# Patient Record
Sex: Female | Born: 1940 | Race: White | Hispanic: No | Marital: Married | Smoking: Never smoker
Health system: Southern US, Community
[De-identification: ages and names within clinical notes are randomized; demographics above are authoritative.]

## PROBLEM LIST (undated history)

## (undated) DIAGNOSIS — I519 Heart disease, unspecified: Secondary | ICD-10-CM

## (undated) DIAGNOSIS — I2510 Atherosclerotic heart disease of native coronary artery without angina pectoris: Secondary | ICD-10-CM

## (undated) DIAGNOSIS — C801 Malignant (primary) neoplasm, unspecified: Secondary | ICD-10-CM

## (undated) DIAGNOSIS — E119 Type 2 diabetes mellitus without complications: Secondary | ICD-10-CM

## (undated) HISTORY — PX: OTHER SURGICAL HISTORY: SHX169

## (undated) HISTORY — PX: STENT REMOVAL: SHX6421

## (undated) HISTORY — PX: CARDIAC SURGERY: SHX584

## (undated) HISTORY — DX: Heart disease, unspecified: I51.9

## (undated) HISTORY — DX: Type 2 diabetes mellitus without complications: E11.9

---

## 2015-10-07 DIAGNOSIS — I25810 Atherosclerosis of coronary artery bypass graft(s) without angina pectoris: Secondary | ICD-10-CM | POA: Insufficient documentation

## 2015-10-07 DIAGNOSIS — E7800 Pure hypercholesterolemia, unspecified: Secondary | ICD-10-CM | POA: Insufficient documentation

## 2016-11-09 ENCOUNTER — Other Ambulatory Visit: Payer: Self-pay | Admitting: Family Medicine

## 2016-11-09 DIAGNOSIS — Z1231 Encounter for screening mammogram for malignant neoplasm of breast: Secondary | ICD-10-CM

## 2016-11-24 ENCOUNTER — Ambulatory Visit (INDEPENDENT_AMBULATORY_CARE_PROVIDER_SITE_OTHER): Payer: Medicare HMO | Admitting: Obstetrics and Gynecology

## 2016-11-24 ENCOUNTER — Other Ambulatory Visit: Payer: Self-pay | Admitting: Obstetrics and Gynecology

## 2016-11-24 ENCOUNTER — Encounter: Payer: Self-pay | Admitting: Obstetrics and Gynecology

## 2016-11-24 VITALS — BP 116/80 | HR 98 | Ht 66.0 in | Wt 163.7 lb

## 2016-11-24 DIAGNOSIS — N903 Dysplasia of vulva, unspecified: Secondary | ICD-10-CM | POA: Diagnosis not present

## 2016-11-24 DIAGNOSIS — Z01419 Encounter for gynecological examination (general) (routine) without abnormal findings: Secondary | ICD-10-CM

## 2016-11-24 MED ORDER — LIDOCAINE HCL 2 % EX GEL
1.0000 | CUTANEOUS | 2 refills | Status: DC | PRN
Start: 2016-11-24 — End: 2020-06-12

## 2016-11-24 NOTE — Progress Notes (Signed)
Subjective:   Morgan Rojas is a 76 y.o. G17P3 Caucasian female here for a routine well-woman exam.  No LMP recorded. Patient is postmenopausal.   Exercising regular. 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 Current complaints: vaginal itching and burning, used monistat and it helped but symptoms are returning. Denies bleeding or vaginal discharge, stopped using bath and body soap and switched to Dove sensitive, with no improvement. Has not been sexually active in years. PCP: [facility]       doesn't desire labs  Social History: Sexual: heterosexual Marital Status: married Living situation: with spouse Occupation: retired Tobacco/alcohol: no tobacco use Illicit drugs: no history of illicit drug use  The following portions of the patient's history were reviewed and updated as appropriate: allergies, current medications, past family history, past medical history, past social history, past surgical history and problem list.  Past Medical History Past Medical History:  Diagnosis Date  . Diabetes mellitus without complication ([place])   . Heart disease     Past Surgical History Past Surgical History:  Procedure Laterality Date  . CARDIAC SURGERY    . carpel tunnel    . STENT REMOVAL      Gynecologic History G3P3  No LMP recorded. Patient is postmenopausal. Contraception: post menopausal status Last Pap: about 10 years ago.Marland Kitchen Results were: normal Last mammogram: ?. Results were: normal   Obstetric History OB History  Gravida Para Term Preterm AB Living  3 3          SAB TAB Ectopic Multiple Live Births               # Outcome Date GA Lbr Len/2nd Weight Sex Delivery Anes PTL Lv  3 Para 1979    F Vag-Spont     2 Para 1970    M Vag-Spont     1 Para 1965    F Vag-Spont         Current Medications No current outpatient prescriptions on file prior to visit.   No current facility-administered medications on file prior to visit.     Review of Systems Patient denies any headaches,  blurred vision, shortness of breath, chest pain, abdominal pain, problems with bowel movements, urination, or intercourse.  Objective:  BP 116/80   Pulse 98   Ht 5\' 6"  (1.676 m)   Wt 163 lb 11.2 oz (74.3 kg)   BMI 26.42 kg/m  Physical Exam  General:  Well developed, well nourished, no acute distress. She is alert and oriented x3. Skin:  Warm and dry Neck:  Midline trachea, no thyromegaly or nodules Cardiovascular: Regular rate and rhythm, no murmur heard Lungs:  Effort normal, all lung fields clear to auscultation bilaterally Breasts:  No dominant palpable mass, retraction, or nipple discharge Abdomen:  Soft, non tender, no hepatosplenomegaly or masses Pelvic:  External genitalia is atrophic with suspicious lesions bilateral on inner labia minora- 62mm punch biopsies obtained. right side with raised white plaque 3cm x 1 cm with irregular boarders, non tender; left lesion with excoriation to subqutaneous layer with irreular boarders approximately 2cm x 3 cm x 1 cm at introitus.  The vagina is atrophic but normal in appearance. The cervix is bulbous, no CMT.  Thin prep pap is done . Uterus is felt to be normal size, shape, and contour.  No adnexal masses or tenderness noted. Extremities:  No swelling or varicosities noted Psych:  She has a normal mood and affect  Assessment:   Healthy well-woman exam Vulvar dysplasia bilaterally  Plan:  Biopsy obtained- will follow up in 4 weeks or sooner if needed. Counseled on concerns for lichens verses vulvar cancer. Instructed to use lidocaine gel and vaseline alternately.  F/U 1 year for AE, or sooner if needed Mammogram planned already for tomorrow Colonoscopy not due  Melody Rockney Ghee, CNM

## 2016-11-24 NOTE — Patient Instructions (Signed)
Vulva Biopsy  A vulva biopsy is a procedure to remove a small sample of tissue from the vulva. The vulva is the outside part of the female genitals. The vulva includes the outside folds of skin (labia majora), the inner lips (labia minora), the clitoris, and the openings of the urethra and vagina.  You may have this procedure to get more information about or diagnose a lesion, growth, rash, blister, or some other unusual discoloration. This procedure may also be done to remove a mole or wart.  Tell a health care provider about:   Any allergies you have.   All medicines you are taking, including vitamins, herbs, eye drops, creams, and over-the-counter medicines.   Any problems you or family members have had with anesthetic medicines.   Any blood disorders you have.   Any surgeries you have had.   Any medical conditions you have.   Whether you are pregnant or may be pregnant.  What are the risks?  Generally, this is a safe procedure. However, problems may occur, including:   Infection.   Bleeding.   Allergic reactions to medicines.   Damage to other structures or organs.   Pain at the biopsy site.    What happens before the procedure?   Wear loose and comfortable pants and underwear for the procedure.   Follow instructions from your health care provider about eating or drinking restrictions.   Ask your health care provider about:  ? Changing or stopping your regular medicines. This is especially important if you are taking diabetes medicines or blood thinners.  ? Taking medicines such as aspirin and ibuprofen. These medicines can thin your blood. Do not take these medicines before your procedure if your health care provider instructs you not to.   Ask your health care provider how your surgical site will be marked or identified.   You may be given antibiotic medicine to help prevent infection.  What happens during the procedure?   To reduce your risk of infection:  ? Your health care team will wash  or sanitize their hands.  ? Your skin will be washed with soap.   You will be given a medicine to numb the area (local anesthetic).   A small tissue sample will be removed (excised). This sample may be sent for further examination depending on why you are having a biopsy.   A medicine may be applied to the biopsy site to help stop the bleeding.   The biopsy site may be closed with stitches (sutures).  The procedure may vary among health care providers and hospitals.  What happens after the procedure?   You may be given pain medicine.   If the sample is being sent for testing, it is your responsibility to get the results of your procedure. Ask your health care provider or the department performing the procedure when your results will be ready.   You may be given antibiotic ointment medicine.  This information is not intended to replace advice given to you by your health care provider. Make sure you discuss any questions you have with your health care provider.  Document Released: 03/30/2012 Document Revised: 09/25/2015 Document Reviewed: 03/04/2015  Elsevier Interactive Patient Education  2018 Elsevier Inc.

## 2016-11-25 LAB — CYTOLOGY - PAP

## 2016-11-27 ENCOUNTER — Telehealth: Payer: Self-pay | Admitting: Obstetrics and Gynecology

## 2016-11-27 NOTE — Telephone Encounter (Signed)
Notified pt of results 

## 2016-11-27 NOTE — Telephone Encounter (Signed)
Please call patient with results - she is worried and would just like to hear something asap

## 2016-11-27 NOTE — Telephone Encounter (Signed)
-----   Message from Joylene Igo, [place] sent at 11/26/2016  1:26 PM EDT ----- Please let her know biopsy shows no cancer, just lichens.  We will discuss more at follow up appointment

## 2016-12-01 NOTE — Telephone Encounter (Signed)
Patient wants to know if you can call in a cream for the itch inside the vagina  [facility] of [address]

## 2016-12-01 NOTE — Telephone Encounter (Signed)
pls advise

## 2016-12-03 NOTE — Telephone Encounter (Signed)
Please let her know I will take care of it at [place] appointment

## 2016-12-03 NOTE — Telephone Encounter (Signed)
Done-ac 

## 2016-12-04 ENCOUNTER — Ambulatory Visit (INDEPENDENT_AMBULATORY_CARE_PROVIDER_SITE_OTHER): Payer: Medicare HMO | Admitting: Obstetrics and Gynecology

## 2016-12-04 ENCOUNTER — Ambulatory Visit
Admission: RE | Admit: 2016-12-04 | Discharge: 2016-12-04 | Disposition: A | Discharge status: Home / Self Care | Payer: Medicare HMO | Source: Ambulatory Visit | Attending: Family Medicine | Admitting: Family Medicine

## 2016-12-04 ENCOUNTER — Encounter: Payer: Self-pay | Admitting: Obstetrics and Gynecology

## 2016-12-04 VITALS — BP 125/68 | HR 72 | Wt 162.0 lb

## 2016-12-04 DIAGNOSIS — Z1231 Encounter for screening mammogram for malignant neoplasm of breast: Secondary | ICD-10-CM | POA: Diagnosis present

## 2016-12-04 DIAGNOSIS — L280 Lichen simplex chronicus: Secondary | ICD-10-CM | POA: Diagnosis not present

## 2016-12-04 HISTORY — DX: Malignant (primary) neoplasm, unspecified: C80.1

## 2016-12-04 MED ORDER — CLOBETASOL PROPIONATE 0.05 % EX OINT: 1.0000 "application " | TOPICAL_OINTMENT | Freq: Two times a day (BID) | CUTANEOUS | 2 refills | Status: DC

## 2016-12-04 NOTE — Patient Instructions (Signed)
Lichen Planus Lichen planus is a skin problem that causes redness, itching, small bumps, and sores. It can affect the skin in any area of the body. Some common areas affected include:  Arms, wrists, legs, or ankles.  Chest, back, or abdomen.  Genital areas such as the vulva and vagina.  Gums and inside of the mouth.  Scalp.  Fingernails or toenails.  Treatment can help control the symptoms of this condition. The condition can last for a long time. It can take 6-18 months for it to go away. What are the causes? The exact cause of this condition is not known. The condition is not passed from one person to another (not contagious). It may be related to an allergy or an autoimmune response. An autoimmune response occurs when the body's defense system (immune system) mistakenly attacks healthy tissues. What increases the risk? This condition is more likely to develop in:  People who are older than 75 years of age.  People who take certain medicines.  People who have been exposed to certain dyes or chemicals.  People with hepatitis C.  What are the signs or symptoms? Symptoms of this condition may include:  Itching, which can be severe.  Small reddish or purple bumps on the skin. These may have flat tops and may be round or irregular shaped.  Redness or white patches on the gums or tongue.  Redness, soreness, or a burning feeling in the genital area. This may lead to pain or bleeding during sex.  Changes in the fingernails or toenails. The nails may become thin or rough. They may have ridges in them.  Redness or irritation of the eyes. This is rare.  How is this diagnosed? This condition may be diagnosed based on:  A physical exam. The health care provider will examine your affected skin and check for changes inside your mouth.  Removal of a tissue sample (biopsy sample) to be looked at under a microscope.  How is this treated? Treatment for this condition may depend on  the severity of symptoms. In some cases, no treatment is needed. If treatment is needed to control symptoms, it may include:  Creams or ointments (topical steroids) to help control itching and irritation.  Medicine to be taken by mouth.  A treatment in which your skin is exposed to ultraviolet light (phototherapy).  Lozenges that you suck on to help treat sores in the mouth.  Follow these instructions at home:  Take over-the-counter and prescription medicines only as told by your health care provider.  Use creams or ointments as told by your health care provider.  Do not scratch the affected areas of skin.  Women should keep the vaginal area as clean and dry as possible. Contact a health care provider if:  You have increasing redness, swelling, or pain in the affected area.  You have fluid, blood, or pus coming from the affected area.  Your eyes become irritated. This information is not intended to replace advice given to you by your health care provider. Make sure you discuss any questions you have with your health care provider. Document Released: 09/04/2010 Document Revised: 09/19/2015 Document Reviewed: 07/09/2014 Elsevier Interactive Patient Education  2018 Elsevier Inc.  

## 2016-12-04 NOTE — Progress Notes (Signed)
Subjective:     Patient ID: Morgan Rojas, female   DOB: 1940-08-16, 76 y.o.   MRN: [id number]  HPI Here to review pathology and discuss treatment.  Seen on 11/24/16 for vulvar irritation and dysplasia- biopsy showed lichens. Pap was negative too.  Review of Systems Still itching at vaginal opening.   otherwise negative Objective:   Physical Exam A&O Well groomed female in no distress Blood pressure 125/68, pulse 72, weight 162 lb (73.5 kg). Pelvic exam: VULVA: vulvar lesion not changed and biopsy site well healed, vulvar hypopigmentation c/w postmenopausal atrophy.    Assessment:     Lichens planus of vulva    Plan:     clobex ointment bid x 4 weeks then nightly x 4 weeks. RTC in 8 weeks for recheck or sooner if needed.  Celisse Ciulla [place], CNM

## 2016-12-10 ENCOUNTER — Inpatient Hospital Stay
Admission: RE | Admit: 2016-12-10 | Discharge: 2016-12-10 | Disposition: A | Discharge status: Home / Self Care | Payer: Self-pay | Source: Ambulatory Visit | Attending: *Deleted | Admitting: *Deleted

## 2016-12-10 ENCOUNTER — Other Ambulatory Visit: Payer: Self-pay | Admitting: *Deleted

## 2016-12-10 DIAGNOSIS — Z9289 Personal history of other medical treatment: Secondary | ICD-10-CM

## 2017-01-29 ENCOUNTER — Ambulatory Visit (INDEPENDENT_AMBULATORY_CARE_PROVIDER_SITE_OTHER): Payer: Medicare HMO | Admitting: Obstetrics and Gynecology

## 2017-01-29 ENCOUNTER — Encounter: Payer: Self-pay | Admitting: Obstetrics and Gynecology

## 2017-01-29 VITALS — BP 147/63 | HR 78 | Ht 66.0 in | Wt 162.4 lb

## 2017-01-29 DIAGNOSIS — L280 Lichen simplex chronicus: Secondary | ICD-10-CM

## 2017-01-29 NOTE — Progress Notes (Signed)
Subjective:     Patient ID: Morgan Rojas, female   DOB: 12/28/1940, 76 y.o.   MRN: [id number]  HPI Here for recheck of vulvar lichens, states she has no more itching or spotting and feels much better. Last seen 8 weeks ago. Denies any other concerns.Review of Systems negative     Objective:   Physical Exam A&Ox4 Well groomed female in nodistress Blood pressure (!) 147/63, pulse 78, height 5\' 6"  (1.676 m), weight 162 lb 6.4 oz (73.7 kg). Pelvic exam: VULVA: normal appearing vulva with no masses, tenderness or lesions, vulvar hypopigmentation at introitus c/w lichens, small red patch where biopsy was obtained, not irritated. 69% improvement in lichenification, VAGINA: atrophic.    Assessment:     Lichens-improving    Plan:     To continue clobex cream at bedtime for 6 weeks then as needed.  RTC as needed.  Melody [place], CNM

## 2017-09-27 ENCOUNTER — Encounter: Payer: Self-pay | Admitting: Dietician

## 2017-09-27 ENCOUNTER — Encounter: Payer: Medicare HMO | Attending: Family Medicine | Admitting: Dietician

## 2017-09-27 VITALS — Wt 173.1 lb

## 2017-09-27 DIAGNOSIS — E119 Type 2 diabetes mellitus without complications: Secondary | ICD-10-CM | POA: Diagnosis not present

## 2017-09-27 DIAGNOSIS — Z713 Dietary counseling and surveillance: Secondary | ICD-10-CM | POA: Insufficient documentation

## 2017-09-27 DIAGNOSIS — E118 Type 2 diabetes mellitus with unspecified complications: Secondary | ICD-10-CM

## 2017-09-27 NOTE — Patient Instructions (Addendum)
Balance meals with protein (1-3 oz), 2-3 (30-45gms) servings of carbohydrate and non-starchy vegetables. Refer to hand-out for examples reviewed. Switch to a lower sugar cereal such as cheerios. Avoid fruit with cold cereals.  Can include fruit 2-3 times per day but refer to list to see portion size. Read labels for carbohydrate. Can subtract fiber.

## 2017-09-27 NOTE — Progress Notes (Signed)
Medical Nutrition Therapy: Visit start time: 1330 end time:1430  Assessment:  Diagnosis: Type 2 Diabetes Past medical history: coronary artery disease,  Psychosocial issues/ stress concerns: Patient rates he stress as "low" and indicates "ok" as to how well she is dealing with her stress. Preferred learning method:  . Auditory . Visual Current weight:173.1 lbs     Height: 66 in Medications, supplements:see list Progress and evaluation:  Patient in for initial medical nutrition therapy appointment. She reports that last year she lost approximately 20 lbs and weighed 150 lbs. She reports that she started eating candy at Christmas and then reverted to previous eating habits. She has regained the 20 lbs and her HgA1c increased from 6.4 (10/'18) to 7 (4/'19). She reports she is now trying to make healthier choices and her most recent FBG's have been in 110's-120's range. Her main beverage is water and she drinks 3-4 (16oz) bottles daily. She also drinks unsweetened tea/stevia or splenda and occasionally a diet soda; no sugar sweetened beverages.  Physical activity: was walking 6 days/week for 25 minutes. Patient states she has hurt her back and has not been able to exercise recently. She expressed interest in [employer] program  Dietary Intake:  Usual eating pattern includes 3  meals and 1-2 snacks per day. Dining out frequency: 5 meals per week.  Breakfast: 8:30am- frosted flakes with blueberries or raspberries, 1% milk Lunch: usually "out" - salad with grilled chicken or grilled salmon, unsweetened tea Snack:1/2 orange Supper: [place] tacos, or spaghetti, unsweet.tea Snack: rice cake or yogurt or 1/4 cup ice cream (just a taste)  Nutrition Care Education: Diabetes:   Instructed on a meal plan for diabetes based on 1400 calories including carbohydrate counting, portion control and how to better balance carbohydrate, protein and non-starchy vegetables. Encouraged to use meal plan  as a guide to be more mindful in food choices and combinations. Use food guide plate and food models to show portions. Gave and reviewed meal and snack examples. Exercise: Discussed role of exercise with both glucose control and weight management. Gave coupon to [employer] program if she wants to try it out once her back is better.  Nutritional Diagnosis:  [place]-3.4 Unintentional weight gain As related to reverting to previous eating habits of increased sweets and higher fat foods.  As evidenced by diet history and 20 lb weight gain in the past 6 months..  Intervention: Balance meals with protein (1-3 oz), 2-3 (30-45gms) servings of carbohydrate and non-starchy vegetables. Refer to hand-out for examples reviewed. Switch to a lower sugar cereal such as cheerios. Avoid fruit with cold cereals.  Can include fruit 2-3 times per day but refer to list to see portion size. Read labels for carbohydrate. Can subtract fiber.  Education Materials given:  . Plate Planner . [employer] Chief Strategy Officer . Food lists/ Planning A Balanced Meal . Sample meal pattern/ menus . Snacking handout . Goals/ instructions  Learner/ who was taught:  . Patient  Level of understanding: . Partial understanding; needs review/ practice Demonstrated degree of understanding via:   Teach back Learning barriers: . None Willingness to learn/ readiness for change: . Change in progress  Monitoring and Evaluation:  Dietary intake, exercise, and body weight      follow up: Patient did not want to schedule a follow-up appointment at this time. Encouraged her to call if desires further help with her diet/nutrition.

## 2018-03-15 ENCOUNTER — Other Ambulatory Visit: Payer: Self-pay | Admitting: Obstetrics and Gynecology

## 2018-06-28 ENCOUNTER — Other Ambulatory Visit: Payer: Self-pay | Admitting: Obstetrics and Gynecology

## 2018-06-28 ENCOUNTER — Other Ambulatory Visit: Payer: Self-pay | Admitting: Physician Assistant

## 2018-06-28 DIAGNOSIS — M25512 Pain in left shoulder: Secondary | ICD-10-CM

## 2018-06-28 DIAGNOSIS — S46012A Strain of muscle(s) and tendon(s) of the rotator cuff of left shoulder, initial encounter: Secondary | ICD-10-CM

## 2018-09-09 ENCOUNTER — Other Ambulatory Visit: Payer: Self-pay

## 2018-09-09 ENCOUNTER — Ambulatory Visit
Admission: RE | Admit: 2018-09-09 | Discharge: 2018-09-09 | Disposition: A | Discharge status: Home / Self Care | Payer: Medicare HMO | Source: Ambulatory Visit | Attending: Physician Assistant | Admitting: Physician Assistant

## 2018-09-09 DIAGNOSIS — M25512 Pain in left shoulder: Secondary | ICD-10-CM

## 2018-09-09 DIAGNOSIS — S46012A Strain of muscle(s) and tendon(s) of the rotator cuff of left shoulder, initial encounter: Secondary | ICD-10-CM

## 2019-08-04 ENCOUNTER — Ambulatory Visit: Payer: Medicare HMO | Attending: Internal Medicine

## 2019-08-04 DIAGNOSIS — Z23 Encounter for immunization: Secondary | ICD-10-CM

## 2019-08-04 NOTE — Progress Notes (Signed)
   Covid-19 Vaccination Clinic  Name:  Morgan Rojas    MRN: [id number] DOB: 1941/01/07  08/04/2019  [name] was observed post Covid-19 immunization for 15 minutes without incident. She was provided with Vaccine Information Sheet and instruction to access the V-Safe system.   [name] was instructed to call 911 with any severe reactions post vaccine: Marland Kitchen Difficulty breathing  . Swelling of face and throat  . A fast heartbeat  . A bad rash all over body  . Dizziness and weakness   Immunizations Administered    Name Date Dose VIS Date Route   Pfizer COVID-19 Vaccine 08/04/2019  9:49 AM 0.3 mL 04/07/2019 Intramuscular   Manufacturer: [place]   Lot: [id number]   [place]: [id number]

## 2019-08-29 ENCOUNTER — Ambulatory Visit: Payer: Medicare HMO | Attending: Internal Medicine

## 2019-08-29 DIAGNOSIS — Z23 Encounter for immunization: Secondary | ICD-10-CM

## 2019-08-29 NOTE — Progress Notes (Signed)
   Covid-19 Vaccination Clinic  Name:  Morgan Rojas    MRN: [id number] DOB: [birth year]  08/29/2019  Morgan Rojas was observed post Covid-19 immunization for 15 minutes without incident. She was provided with Vaccine Information Sheet and instruction to access the V-Safe system.   Morgan Rojas was instructed to call 911 with any severe reactions post vaccine: Marland Kitchen Difficulty breathing  . Swelling of face and throat  . A fast heartbeat  . A bad rash all over body  . Dizziness and weakness   Immunizations Administered    Name Date Dose VIS Date Route   Pfizer COVID-19 Vaccine 08/29/2019  1:52 PM 0.3 mL 06/21/2018 Intramuscular   Manufacturer: [place]   Lot: [id number]   [place]: [id number]

## 2019-11-08 ENCOUNTER — Other Ambulatory Visit: Payer: Self-pay | Admitting: Family Medicine

## 2019-11-08 DIAGNOSIS — Z1231 Encounter for screening mammogram for malignant neoplasm of breast: Secondary | ICD-10-CM

## 2019-11-27 ENCOUNTER — Other Ambulatory Visit: Payer: Self-pay

## 2019-11-27 ENCOUNTER — Ambulatory Visit
Admission: RE | Admit: 2019-11-27 | Discharge: 2019-11-27 | Disposition: A | Discharge status: Home / Self Care | Payer: Medicare HMO | Source: Ambulatory Visit | Attending: Family Medicine | Admitting: Family Medicine

## 2019-11-27 DIAGNOSIS — Z1231 Encounter for screening mammogram for malignant neoplasm of breast: Secondary | ICD-10-CM | POA: Diagnosis not present

## 2020-06-12 ENCOUNTER — Encounter: Payer: Self-pay | Admitting: Emergency Medicine

## 2020-06-12 ENCOUNTER — Observation Stay
Admission: EM | Admit: 2020-06-12 | Discharge: 2020-06-13 | Disposition: A | Discharge status: Home / Self Care | Payer: Medicare HMO | Attending: Internal Medicine | Admitting: Internal Medicine

## 2020-06-12 ENCOUNTER — Other Ambulatory Visit: Payer: Self-pay

## 2020-06-12 ENCOUNTER — Emergency Department: Payer: Medicare HMO

## 2020-06-12 DIAGNOSIS — I10 Essential (primary) hypertension: Secondary | ICD-10-CM | POA: Insufficient documentation

## 2020-06-12 DIAGNOSIS — I2510 Atherosclerotic heart disease of native coronary artery without angina pectoris: Secondary | ICD-10-CM | POA: Diagnosis present

## 2020-06-12 DIAGNOSIS — Z79899 Other long term (current) drug therapy: Secondary | ICD-10-CM | POA: Insufficient documentation

## 2020-06-12 DIAGNOSIS — I25810 Atherosclerosis of coronary artery bypass graft(s) without angina pectoris: Secondary | ICD-10-CM | POA: Diagnosis not present

## 2020-06-12 DIAGNOSIS — E119 Type 2 diabetes mellitus without complications: Secondary | ICD-10-CM | POA: Diagnosis not present

## 2020-06-12 DIAGNOSIS — R55 Syncope and collapse: Secondary | ICD-10-CM | POA: Diagnosis not present

## 2020-06-12 DIAGNOSIS — Z7984 Long term (current) use of oral hypoglycemic drugs: Secondary | ICD-10-CM | POA: Insufficient documentation

## 2020-06-12 DIAGNOSIS — Z7982 Long term (current) use of aspirin: Secondary | ICD-10-CM | POA: Insufficient documentation

## 2020-06-12 DIAGNOSIS — Z85828 Personal history of other malignant neoplasm of skin: Secondary | ICD-10-CM | POA: Diagnosis not present

## 2020-06-12 DIAGNOSIS — U071 COVID-19: Secondary | ICD-10-CM | POA: Diagnosis not present

## 2020-06-12 HISTORY — DX: Atherosclerotic heart disease of native coronary artery without angina pectoris: I25.10

## 2020-06-12 LAB — URINALYSIS, COMPLETE (UACMP) WITH MICROSCOPIC
Bacteria, UA: NONE SEEN
Bilirubin Urine: NEGATIVE
Glucose, UA: 50 mg/dL — AB
Ketones, ur: NEGATIVE mg/dL
Nitrite: NEGATIVE
Protein, ur: NEGATIVE mg/dL
Specific Gravity, Urine: 1.011 (ref 1.005–1.030)
pH: 5 (ref 5.0–8.0)

## 2020-06-12 LAB — CBC
HCT: 42.9 % (ref 36.0–46.0)
Hemoglobin: 14.8 g/dL (ref 12.0–15.0)
MCH: 29 pg (ref 26.0–34.0)
MCHC: 34.5 g/dL (ref 30.0–36.0)
MCV: 84.1 fL (ref 80.0–100.0)
Platelets: 182 10*3/uL (ref 150–400)
RBC: 5.1 MIL/uL (ref 3.87–5.11)
RDW: 12.3 % (ref 11.5–15.5)
WBC: 4.6 10*3/uL (ref 4.0–10.5)
nRBC: 0 % (ref 0.0–0.2)

## 2020-06-12 LAB — LACTATE DEHYDROGENASE: LDH: 171 U/L (ref 98–192)

## 2020-06-12 LAB — RESP PANEL BY RT-PCR (FLU A&B, COVID) ARPGX2
Influenza A by PCR: NEGATIVE
Influenza B by PCR: NEGATIVE
SARS Coronavirus 2 by RT PCR: POSITIVE — AB

## 2020-06-12 LAB — BASIC METABOLIC PANEL
Anion gap: 11 (ref 5–15)
BUN: 21 mg/dL (ref 8–23)
CO2: 23 mmol/L (ref 22–32)
Calcium: 9.1 mg/dL (ref 8.9–10.3)
Chloride: 99 mmol/L (ref 98–111)
Creatinine, Ser: 1.19 mg/dL — ABNORMAL HIGH (ref 0.44–1.00)
GFR, Estimated: 47 mL/min — ABNORMAL LOW (ref >60)
Glucose, Bld: 209 mg/dL — ABNORMAL HIGH (ref 70–99)
Potassium: 4 mmol/L (ref 3.5–5.1)
Sodium: 133 mmol/L — ABNORMAL LOW (ref 135–145)

## 2020-06-12 LAB — CK: Total CK: 118 U/L (ref 38–234)

## 2020-06-12 LAB — D-DIMER, QUANTITATIVE: D-Dimer, Quant: 0.55 ug/mL-FEU — ABNORMAL HIGH (ref 0.00–0.50)

## 2020-06-12 LAB — MAGNESIUM: Magnesium: 1.9 mg/dL (ref 1.7–2.4)

## 2020-06-12 LAB — FIBRINOGEN: Fibrinogen: 427 mg/dL (ref 210–475)

## 2020-06-12 LAB — TROPONIN I (HIGH SENSITIVITY): Troponin I (High Sensitivity): 6 ng/L (ref <18)

## 2020-06-12 LAB — FERRITIN: Ferritin: 174 ng/mL (ref 11–307)

## 2020-06-12 MED ORDER — LACTATED RINGERS IV SOLN: INTRAVENOUS | Status: AC

## 2020-06-12 MED ORDER — SODIUM CHLORIDE 0.9 % IV SOLN
100.0000 mg | Freq: Every day | INTRAVENOUS | Status: DC
  Administered 2020-06-13: 100 mg via INTRAVENOUS
  Filled 2020-06-12: qty 20

## 2020-06-12 MED ORDER — ATORVASTATIN CALCIUM 20 MG PO TABS
10.0000 mg | ORAL_TABLET | Freq: Every day | ORAL | Status: DC
Start: 2020-06-12 — End: 2020-06-13
  Administered 2020-06-13: 10 mg via ORAL
  Filled 2020-06-12 (×2): qty 1

## 2020-06-12 MED ORDER — LINAGLIPTIN 5 MG PO TABS
5.0000 mg | ORAL_TABLET | Freq: Every day | ORAL | Status: DC
  Administered 2020-06-13: 5 mg via ORAL
  Filled 2020-06-12 (×3): qty 1

## 2020-06-12 MED ORDER — ASPIRIN 81 MG PO CHEW
81.0000 mg | CHEWABLE_TABLET | Freq: Every day | ORAL | Status: DC
  Administered 2020-06-13: 09:00:00 81 mg via ORAL
  Filled 2020-06-12: qty 1

## 2020-06-12 MED ORDER — INSULIN ASPART 100 UNIT/ML ~~LOC~~ SOLN
0.0000 [IU] | Freq: Three times a day (TID) | SUBCUTANEOUS | Status: DC
  Administered 2020-06-13: 12:00:00 5 [IU] via SUBCUTANEOUS
  Administered 2020-06-13: 08:00:00 1 [IU] via SUBCUTANEOUS
  Filled 2020-06-12 (×2): qty 1

## 2020-06-12 MED ORDER — SERTRALINE HCL 50 MG PO TABS
25.0000 mg | ORAL_TABLET | Freq: Every day | ORAL | Status: DC
  Administered 2020-06-13: 09:00:00 25 mg via ORAL
  Filled 2020-06-12: qty 1

## 2020-06-12 MED ORDER — ACETAMINOPHEN 325 MG PO TABS
650.0000 mg | ORAL_TABLET | Freq: Four times a day (QID) | ORAL | Status: DC | PRN
  Administered 2020-06-12: 23:00:00 650 mg via ORAL
  Filled 2020-06-12: qty 2

## 2020-06-12 MED ORDER — SODIUM CHLORIDE 0.9 % IV SOLN
200.0000 mg | Freq: Once | INTRAVENOUS | Status: AC
  Administered 2020-06-12: 22:00:00 200 mg via INTRAVENOUS
  Filled 2020-06-12: qty 40

## 2020-06-12 MED ORDER — ACETAMINOPHEN 650 MG RE SUPP: 650.0000 mg | Freq: Four times a day (QID) | RECTAL | Status: DC | PRN

## 2020-06-12 MED ORDER — ALBUTEROL SULFATE HFA 108 (90 BASE) MCG/ACT IN AERS
1.0000 | INHALATION_SPRAY | RESPIRATORY_TRACT | Status: DC | PRN
  Filled 2020-06-12: qty 6.7

## 2020-06-12 NOTE — ED Notes (Signed)
Pt states coming in after having 3 episodes of having LOC. Pt states she had 2 episodes this past Sunday and 1 on Monday. Pt states she does not remember them, but that she "wakes up" on the floor. Pt denies pain. Pt denies any previous history of LOC. Pts husband at bedside. Cardiac, bp and pulse ox monitor on pt. Pt states "I do not believe I ever hit my head."

## 2020-06-12 NOTE — ED Notes (Signed)
Daughter Suanne Marker called and updated per pt's request.

## 2020-06-12 NOTE — ED Notes (Signed)
Pt states she was able to talk with her husband on the phone, several minutes ago

## 2020-06-12 NOTE — ED Notes (Signed)
RN attempted to start an IV. RN stuck x2 to the left arm with a 22g. Catheter in tact, bleeding controlled.

## 2020-06-12 NOTE — ED Notes (Signed)
Pt provided with peanutbutter and crackers with permission from provider.  Pt taken to CT by CT tech

## 2020-06-12 NOTE — ED Provider Notes (Signed)
[facility] Emergency Department Provider Note   ____________________________________________   Event Date/Time   First MD Initiated Contact with Patient 06/12/20 1534     (approximate)  I have reviewed the triage vital signs and the nursing notes.   HISTORY  Chief Complaint Loss of Consciousness    HPI Morgan Rojas is a 80 y.o. female with a history of type 2 diabetes and CAD who presents for 3 episodes of syncope over the last 2 days.  Patient states that these episodes have come when she is doing her normal activity around the house.  Patient denies any orthostatic change in her lightheadedness.  Patient states she has been having some diarrhea as well as woke up with sweats in the middle of the night last night.  Patient denies any head trauma during these falls.  Patient currently denies any vision changes, tinnitus, difficulty speaking, facial droop, sore throat, chest pain, shortness of breath, abdominal pain, nausea/vomiting/diarrhea, dysuria, or numbness/paresthesias in any extremity         Past Medical History:  Diagnosis Date  . CAD (coronary artery disease)    s/p CABG in 2014 in the state of [place]  . Cancer ([place])    skin  . Diabetes mellitus without complication ([place])   . Heart disease     Patient Active Problem List   Diagnosis Date Noted  . Syncope and collapse 06/12/2020  . Syncope 06/12/2020  . COVID-19 virus infection 06/12/2020  . DM2 (diabetes mellitus, type 2) ([place]) 06/12/2020  . CAD (coronary artery disease)     Past Surgical History:  Procedure Laterality Date  . CARDIAC SURGERY    . carpel tunnel    . STENT REMOVAL      Prior to Admission medications   Medication Sig Start Date End Date Taking? Authorizing Provider  aspirin 81 MG chewable tablet Chew 81 mg by mouth daily.    [provider]  atorvastatin (LIPITOR) 10 MG tablet Take 10 mg by mouth daily.    [provider]  fluticasone  (FLONASE) 50 MCG/ACT nasal spray Place 2 sprays into both nostrils daily. 03/22/20   [provider]  glimepiride (AMARYL) 4 MG tablet Take 4 mg by mouth daily with breakfast.    [provider]  losartan (COZAAR) 25 MG tablet Take 25 mg by mouth daily.    [provider]  nitroGLYCERIN (NITROSTAT) 0.4 MG SL tablet Place 0.4 mg under the tongue every 5 (five) minutes as needed for chest pain.    [provider]  sertraline (ZOLOFT) 25 MG tablet Take 25 mg by mouth daily.    [provider]    Allergies Metformin and related, Penicillins, Simvastatin, and Sulfa antibiotics  Family History  Problem Relation Age of Onset  . Endometriosis Mother   . Endometriosis Sister   . Breast cancer Neg Hx     Social History Social History   Tobacco Use  . Smoking status: Never Smoker  . Smokeless tobacco: Never Used  Vaping Use  . Vaping Use: Never used  Substance Use Topics  . Alcohol use: No  . Drug use: No    Review of Systems Constitutional: No fever/chills Eyes: No visual changes. ENT: No sore throat. Cardiovascular: Denies chest pain. Respiratory: Denies shortness of breath. Gastrointestinal: No abdominal pain.  No nausea, no vomiting.  Endorses diarrhea Genitourinary: Negative for dysuria. Musculoskeletal: Negative for acute arthralgias Skin: Negative for rash. Neurological: Negative for headaches, numbness/paresthesias in any extremity Psychiatric:  Negative for suicidal ideation/homicidal ideation   ____________________________________________   PHYSICAL EXAM:  VITAL SIGNS: ED Triage Vitals  Enc Vitals Group     BP 06/12/20 1320 125/71     Pulse Rate 06/12/20 1320 74     Resp 06/12/20 1320 20     Temp 06/12/20 1320 98.1 F (36.7 C)     Temp Source 06/12/20 1320 Oral     SpO2 06/12/20 1320 98 %     Weight 06/12/20 1314 156 lb (70.8 kg)     Height 06/12/20 1314 5\' 5"  (1.651 m)     Head Circumference --      Peak Flow --       Pain Score 06/12/20 1314 0     Pain Loc --      Pain Edu? --      Excl. in [place]? --    Constitutional: Alert and oriented. Well appearing and in no acute distress. Eyes: Conjunctivae are normal. PERRL. Head: Atraumatic. Nose: No congestion/rhinnorhea. Mouth/Throat: Mucous membranes are moist. Neck: No stridor Cardiovascular: Grossly normal heart sounds.  Good peripheral circulation. Respiratory: Normal respiratory effort.  No retractions. Gastrointestinal: Soft and nontender. No distention. Musculoskeletal: No obvious deformities Neurologic:  Normal speech and language. No gross focal neurologic deficits are appreciated. Skin:  Skin is warm and dry. No rash noted. Psychiatric: Mood and affect are normal. Speech and behavior are normal.  ____________________________________________   LABS (all labs ordered are listed, but only abnormal results are displayed)  Labs Reviewed  RESP PANEL BY RT-PCR (FLU A&B, COVID) ARPGX2 - Abnormal; Notable for the following components:      Result Value   SARS Coronavirus 2 by RT PCR POSITIVE (*)    All other components within normal limits  BASIC METABOLIC PANEL - Abnormal; Notable for the following components:   Sodium 133 (*)    Glucose, Bld 209 (*)    Creatinine, Ser 1.19 (*)    GFR, Estimated 47 (*)    All other components within normal limits  URINALYSIS, COMPLETE (UACMP) WITH MICROSCOPIC - Abnormal; Notable for the following components:   Color, Urine YELLOW (*)    APPearance HAZY (*)    Glucose, UA 50 (*)    Hgb urine dipstick SMALL (*)    Leukocytes,Ua LARGE (*)    All other components within normal limits  URINE CULTURE  CBC  MAGNESIUM  PROCALCITONIN  PHOSPHORUS  MAGNESIUM  COMPREHENSIVE METABOLIC PANEL  CBC WITH DIFFERENTIAL/PLATELET  C-REACTIVE PROTEIN  FERRITIN  FIBRINOGEN  LACTATE DEHYDROGENASE  C-REACTIVE PROTEIN  FERRITIN  FIBRINOGEN  LACTATE DEHYDROGENASE  FIBRIN DERIVATIVES D-DIMER ([facility] ONLY)  FIBRIN  DERIVATIVES D-DIMER ([facility] ONLY)  CK  HEMOGLOBIN A1C  TROPONIN I (HIGH SENSITIVITY)   ____________________________________________  EKG  ED ECG REPORT I, Naaman Plummer, the attending physician, personally viewed and interpreted this ECG.  Date: 06/12/2020 EKG Time: 1318 Rate: 74 Rhythm: normal sinus rhythm QRS Axis: normal Intervals: Right bundle branch block ST/T Wave abnormalities: normal Narrative Interpretation: no evidence of acute ischemia  ____________________________________________  RADIOLOGY  ED MD interpretation: CT of the head without contrast shows no evidence of acute abnormalities including no intracranial hemorrhage, edema, or obvious masses  One-view portable chest x-ray shows no evidence of acute abnormalities including no pneumonia, pneumothorax, or widened mediastinum  Official radiology report(s): CT Head Wo Contrast  Result Date: 06/12/2020 CLINICAL DATA:  Altered mental status, dizziness. EXAM: CT HEAD WITHOUT CONTRAST TECHNIQUE: Contiguous axial images were obtained from the base of the  skull through the vertex without intravenous contrast. COMPARISON:  None. FINDINGS: Brain: No evidence of acute infarction, hemorrhage, hydrocephalus, extra-axial collection or mass lesion/mass effect. Vascular: No hyperdense vessel or unexpected calcification. Skull: Normal. Negative for fracture or focal lesion. Sinuses/Orbits: No acute finding. Other: None. IMPRESSION: Normal head CT. Electronically Signed   By: Marijo Conception M.D.   On: 06/12/2020 16:42   DG Chest Port 1 View  Result Date: 06/12/2020 CLINICAL DATA:  Syncope EXAM: PORTABLE CHEST 1 VIEW COMPARISON:  Report 06/22/2018 FINDINGS: Post sternotomy changes with fracture through the first and second sternal wires. Wire fragments over the right upper paramediastinal region. No focal opacity or pleural effusion. Borderline cardiomegaly. No pneumothorax. IMPRESSION: No active disease. Borderline cardiomegaly. Prior  sternotomy with apparent sternal wire fragments projecting over the right upper paramediastinal area. Electronically Signed   By: Donavan Foil M.D.   On: 06/12/2020 16:54    ____________________________________________   PROCEDURES  Procedure(s) performed (including Critical Care):  .1-3 Lead EKG Interpretation Performed by: Naaman Plummer, MD Authorized by: Naaman Plummer, MD     Interpretation: normal     ECG rate:  69   ECG rate assessment: normal     Rhythm: sinus rhythm     Ectopy: none     Conduction: normal       ____________________________________________   INITIAL IMPRESSION / ASSESSMENT AND PLAN / ED COURSE  As part of my medical decision making, I reviewed the following data within the [place] notes reviewed and incorporated, Labs reviewed, EKG interpreted, Old chart reviewed, Radiograph reviewed  and Notes from prior ED visits reviewed and incorporated        Patient presents with complaints of syncope/presyncope ED Workup:  CBC, BMP, Troponin, BNP, ECG, CXR Differential diagnosis includes HF, ICH, seizure, stroke, HOCM, ACS, aortic dissection, malignant arrhythmia, or GI bleed. Findings: No evidence of acute laboratory abnormalities.  Troponin negative x1 EKG: No e/o STEMI. No evidence of Brugadas sign, delta wave, epsilon wave, significantly prolonged QTc, or malignant arrhythmia. Covid positive Disposition: Admit to medicine, telemetry bed for cardiac monitoring and cardiology review.      ____________________________________________   FINAL CLINICAL IMPRESSION(S) / ED DIAGNOSES  Final diagnoses:  Syncope and collapse  COVID-19 virus infection     ED Discharge Orders    None       Note:  This document was prepared using Dragon voice recognition software and may include unintentional dictation errors.   Naaman Plummer, MD 06/12/20 [phone]

## 2020-06-12 NOTE — ED Triage Notes (Signed)
Pt comes into the ED via [facility] c/o 3 syncopal episodes since Monday.  Pt currently has even and unlabored respirations and is in NAD.  Pt states that last night she woke up due to diaphoresis and she also has been having some diarrhea.  Pt denies any CP but states she was having some dizziness right before she had LOC.  Pt denies hitting her head on any of the falls during episodes of LOC.

## 2020-06-12 NOTE — H&P (Signed)
History and Physical    PLEASE NOTE THAT DRAGON DICTATION SOFTWARE WAS USED IN THE CONSTRUCTION OF THIS NOTE.   Morgan Rojas IZT:[id number] DOB: 1940/11/22 DOA: 06/12/2020  PCP: [place] Pink, MD Patient coming from: home   I have personally briefly reviewed patient's old medical records in [place]  Chief Complaint: loss of consciousness  HPI: Morgan Rojas is a 80 y.o. female with medical history significant for coronary artery disease status post CABG in 2014, type 2 diabetes mellitus, hypertension, vertigo who is admitted to [facility] on 06/12/2020 for further evaluation and management syncope after presenting from home to [facility] ED complaining of 3 episodes of loss of consciousness.  Over the last 5 days, the patient reports experiencing 3 episodes of loss of consciousness, with the first episode occurring on Sunday, 06/09/2020, the second episode occurring yesterday, and the third and final episode occurring this morning.  She reports that all 3 episodes were similar in their symptomatology.  Specifically, she reports each episode of loss of consciousness was preceded by arising from a seated to a standing position 1 to 2 minutes prior to developing dizziness and a sensation that she was going to lose consciousness, for actually losing consciousness.  Duration of loss of consciousness in each case is reportedly less than 30 seconds.  Patient is unsure if she hit her head as a component of these episodes, but denies any ensuing headache, neck pain, neck stiffness, change in vision.   Denies any preceding or ensuing associated chest pain, shortness of breath, diaphoresis, palpitations.  She also denies any associated orthopnea, PND, or peripheral edema.  Denies any associated generalized tonic-clonic activity, and reports that these episodes were associated with any tongue biting, or loss of bowel/bladder function.  Has never previously experienced a syncopal  episode.  She acknowledges a history of vertigo, but reports that the 3 episodes of loss of consciousness that she has experienced over the last 3 days are completely different than prior episodes of vertigo, and denies any recent associated sensation of the room spinning.   Over the last 4 days, she reports diminished appetite resulting in diminished oral intake over that time in the absence of any associated nausea or vomiting.  She notes 2 episodes of loose stool over the last 3 days, in the absence of any associated melena, hematochezia, or abdominal discomfort.   Denies any associated acute focal weakness, acute focal paresthesias, numbness, dysphagia, dysarthria, facial droop.   However, over the last 3 to 4 days, she reports new onset nonproductive cough as well as subjective fever in the absence of chills, rigors, or generalized myalgias.  Denies any associated neck stiffness, rhinitis, rhinorrhea, sore throat, wheezing, shortness of breath.  Denies any recent traveling or known COVID-19 exposures.  She denies any recent dysuria, gross hematuria, or change in urinary urgency/frequency.  She has received 2 doses of the [place] COVID-19 vaccine in April and May 2021, but has not received the booster.  Reports that she is a lifelong non-smoker, denies any known chronic underlying pulmonary pathology.  Past medical history notable for coronary artery disease status post CABG in 2014, which she states was performed in the state of [place].  She is on a daily baby aspirin, but otherwise on no blood thinning agents at home.  She also acknowledges a history of type 2 diabetes mellitus, for which she is on glimepiride as her sole outpatient oral hypoglycemic agent.  Denies taking insulin as an outpatient.  ED Course:  Vital signs in the ED were notable for the following: Tetramex 98.1; heart rate 6074; blood pressure 110/55 - 142/70; respiratory rate 17-20: Oxygen saturation 98 to 100% on room air.    Labs were notable for the following: BMP was notable for the following: Sodium 133, which corrects to approximately 134.5 when taking into account concomitant mild presenting hyperglycemia, potassium 4.0, bicarbonate 23, BUN 21, creatinine 1.19 with no prior serum creatinine data point in our EMR for point of comparison.  CBC was notable for the following: Lipid cell count of 4600, hemoglobin 14.8.  Urinalysis was notable for 21-50 white blood cells, but with no bacteria, and nitrate negative findings, urinalysis was also notable for pain positive for hyaline casts as well as showing small hemoglobin without any corresponding red blood cells.  Nasopharyngeal COVID-19/influenza PCR performed in the ED today, and found to be positive.  EKG showed sinus rhythm with heart rate 74, incomplete right bundle branch block, nonspecific T wave inversion in leads III, V1 through V3, with no prior EKG available for point comparison, and showed no evidence of ST changes, including no evidence of ST elevation.  Noncontrast CT of the head showed no evidence of acute intracranial process, including no evidence of intracranial hemorrhage or acute infarct.  Chest x-ray showed evidence of prior sternotomy, but no evidence of acute cardiopulmonary process, including no evidence of infiltrate, edema, effusion, or pneumothorax.     Review of Systems: As per HPI otherwise 10 point review of systems negative.   Past Medical History:  Diagnosis Date  . Cancer ([place])    skin  . Diabetes mellitus without complication ([place])   . Heart disease     Past Surgical History:  Procedure Laterality Date  . CARDIAC SURGERY    . carpel tunnel    . STENT REMOVAL      Social History:  reports that she has never smoked. She has never used smokeless tobacco. She reports that she does not drink alcohol and does not use drugs.   Allergies  Allergen Reactions  . Metformin And Related   . Penicillins   . Simvastatin   . Sulfa  Antibiotics     Family History  Problem Relation Age of Onset  . Endometriosis Mother   . Endometriosis Sister   . Breast cancer Neg Hx      Prior to Admission medications   Medication Sig Start Date End Date Taking? Authorizing Provider  aspirin 81 MG chewable tablet Chew by mouth daily.    [provider]  atorvastatin (LIPITOR) 10 MG tablet Take 10 mg by mouth daily.    [provider]  clobetasol ointment (TEMOVATE) 5.46 % Apply 1 application topically 2 (two) times daily. Apply to affected area Patient not taking: Reported on 09/27/2017 12/04/16   Shambley, Melody N, CNM  Fluticasone Propionate (FLONASE NA) Place into the nose.    [provider]  glimepiride (AMARYL) 4 MG tablet Take 4 mg by mouth daily with breakfast.    [provider]  lidocaine (XYLOCAINE) 2 % jelly Apply 1 application topically as needed. Patient not taking: Reported on 12/04/2016 11/24/16   Shambley, Melody N, CNM  losartan (COZAAR) 25 MG tablet Take 25 mg by mouth daily.    [provider]  nitroGLYCERIN (NITROSTAT) 0.4 MG SL tablet Place 0.4 mg under the tongue every 5 (five) minutes as needed for chest pain.    [provider]  sertraline (ZOLOFT) 25 MG tablet Take  25 mg by mouth daily.    [provider]     Objective    Physical Exam: Vitals:   06/12/20 1322 06/12/20 1500 06/12/20 1600 06/12/20 1800  BP:  (!) 110/55 (!) 142/70 (!) 125/59  Pulse:  63 60 66  Resp:  18 16 15   Temp:      TempSrc:      SpO2:  99% 100% 100%  Weight: 70.8 kg     Height: 5' 6"  (1.676 m)       General: appears to be stated age; alert, oriented Skin: warm, dry, no rash Head:  AT/[place] Mouth:  Oral mucosa membranes appear dry, normal dentition Neck: supple; trachea midline Heart:  RRR; did not appreciate any M/R/G Lungs: CTAB, did not appreciate any wheezes, rales, or rhonchi Abdomen: + BS; soft, ND, NT Vascular: 2+ pedal pulses b/l; 2+ radial pulses  b/l Extremities: no peripheral edema, no muscle wasting Neuro: strength and sensation intact in upper and lower extremities b/l    Labs on Admission: I have personally reviewed following labs and imaging studies  CBC: Recent Labs  Lab 06/12/20 1323  WBC 4.6  HGB 14.8  HCT 42.9  MCV 84.1  PLT 889   Basic Metabolic Panel: Recent Labs  Lab 06/12/20 1323  NA 133*  K 4.0  CL 99  CO2 23  GLUCOSE 209*  BUN 21  CREATININE 1.19*  CALCIUM 9.1   GFR: Estimated Creatinine Clearance: 36.5 mL/min (A) (by C-G formula based on SCr of 1.19 mg/dL (H)). Liver Function Tests: No results for input(s): AST, ALT, ALKPHOS, BILITOT, PROT, ALBUMIN in the last 168 hours. No results for input(s): LIPASE, AMYLASE in the last 168 hours. No results for input(s): AMMONIA in the last 168 hours. Coagulation Profile: No results for input(s): INR, PROTIME in the last 168 hours. Cardiac Enzymes: No results for input(s): CKTOTAL, CKMB, CKMBINDEX, TROPONINI in the last 168 hours. BNP (last 3 results) No results for input(s): PROBNP in the last 8760 hours. HbA1C: No results for input(s): HGBA1C in the last 72 hours. CBG: No results for input(s): GLUCAP in the last 168 hours. Lipid Profile: No results for input(s): CHOL, HDL, LDLCALC, TRIG, CHOLHDL, LDLDIRECT in the last 72 hours. Thyroid Function Tests: No results for input(s): TSH, T4TOTAL, FREET4, T3FREE, THYROIDAB in the last 72 hours. Anemia Panel: No results for input(s): VITAMINB12, FOLATE, FERRITIN, TIBC, IRON, RETICCTPCT in the last 72 hours. Urine analysis:    Component Value Date/Time   COLORURINE YELLOW (A) 06/12/2020 1323   APPEARANCEUR HAZY (A) 06/12/2020 1323   LABSPEC 1.011 06/12/2020 1323   PHURINE 5.0 06/12/2020 1323   GLUCOSEU 50 (A) 06/12/2020 1323   HGBUR SMALL (A) 06/12/2020 1323   BILIRUBINUR NEGATIVE 06/12/2020 1323   KETONESUR NEGATIVE 06/12/2020 1323   PROTEINUR NEGATIVE 06/12/2020 1323   NITRITE NEGATIVE 06/12/2020  1323   LEUKOCYTESUR LARGE (A) 06/12/2020 1323    Radiological Exams on Admission: CT Head Wo Contrast  Result Date: 06/12/2020 CLINICAL DATA:  Altered mental status, dizziness. EXAM: CT HEAD WITHOUT CONTRAST TECHNIQUE: Contiguous axial images were obtained from the base of the skull through the vertex without intravenous contrast. COMPARISON:  None. FINDINGS: Brain: No evidence of acute infarction, hemorrhage, hydrocephalus, extra-axial collection or mass lesion/mass effect. Vascular: No hyperdense vessel or unexpected calcification. Skull: Normal. Negative for fracture or focal lesion. Sinuses/Orbits: No acute finding. Other: None. IMPRESSION: Normal head CT. Electronically Signed   By: Marijo Conception M.D.   On: 06/12/2020 16:42  DG Chest Port 1 View  Result Date: 06/12/2020 CLINICAL DATA:  Syncope EXAM: PORTABLE CHEST 1 VIEW COMPARISON:  Report 06/22/2018 FINDINGS: Post sternotomy changes with fracture through the first and second sternal wires. Wire fragments over the right upper paramediastinal region. No focal opacity or pleural effusion. Borderline cardiomegaly. No pneumothorax. IMPRESSION: No active disease. Borderline cardiomegaly. Prior sternotomy with apparent sternal wire fragments projecting over the right upper paramediastinal area. Electronically Signed   By: Donavan Foil M.D.   On: 06/12/2020 16:54     EKG: Independently reviewed, with result as described above.    Assessment/Plan   Morgan Rojas is a 80 y.o. female with medical history significant for coronary artery disease status post CABG in 2014, type 2 diabetes mellitus, hypertension, vertigo who is admitted to [facility] on 06/12/2020 for further evaluation and management syncope after presenting from home to [facility] ED complaining of 3 episodes of loss of consciousness.   Principal Problem:   Syncope Active Problems:   Syncope and collapse   COVID-19 virus infection   DM2 (diabetes mellitus,  type 2) (HCC)   CAD (coronary artery disease)    #) Syncope: 3 episodes of apparent syncope over the last 5 days associated with prodrome, and suggest an element of orthostatic hypotension in the setting of the patient's report of recent decline in oral intake as well as 2 episodes of loose stool, particularly the setting of each episode occurring when moving from a seated to standing position.  Not associate with any overt acute focal neurologic deficits.  Clinically, acute ischemic stroke versus seizure appears to be less likely at this time.  Of note, Benoit Meech CT that showed no evidence of acute intracranial process.  Presentation appears less consistent with ACS at this time in the absence of chest and higher clinical suspicion for orthostatic etiology, although we will trend serial troponin values, particular in the setting of a known history of coronary artery disease.  Orthostatic vital signs have been ordered and requested to be documented.  Should orthostatic vital signs by negative, may consider obtaining echocardiogram, but will refrain from ordering this imaging for now.  Once orthostatic vital signs been performed, will initiate gentle overnight IV fluids given suspected contribution from clinical findings of dehydration.  There may also be a secondary pharmacologic factor in the context of home losartan, inhibiting otherwise compensatory peripheral vasoconstriction the setting of suspected orthostatic etiology.  Differential also includes autonomic dysfunction in the setting of a documented history of diabetes.    Plan: orthostatic vital signs x 1 to be checked and documented, following which will initiate gentle IV fluids in the form of lactated Ringer 50 cc/h x 12 hours.. Monitor on telemetry.  Losartan for now.  Monitor strict I's and O's.  Add on serum magnesium level.  Check CMP, CBC, serum mag levels in the morning.  Trend serial troponin.       #) Asymptomatic pyuria: Presenting  urinalysis showed 21-50 white blood cells, but in the absence of any bacteria, and nitrate negative findings.  Patient denies any recent acute urinary symptoms.  Given this constellation laboratory findings as well as the absence of any urinary symptoms, will consider this to represent asymptomatic pyuria for now.  However, will add on urine culture to existing urine specimen, and closely monitor ensuing clinical trend to determine if abx are subsequently indicated. Not septic.   Plan: Add on urine culture to existing urine specimen.  Repeat CBC with differential in the morning.        #)  COVID-19 infection: diagnosis on the basis of 2 to 3 days of new onset nonproductive cough with 2 episodes of loose stool and associated subjective fever, with nasopharyngeal COVID-19 PCR performed in the ED this evening found to be positive. Of note, presentation does not appear to be associated with acute hypoxic respiratory distress/failure, with patient maintaining O2 sats greater than 94% on room air.  Additionally, chest x-ray shows no evidence of acute cardiopulmonary process.Overall, it does not appear that criteria are met at the present time for patient's COVID-19 infection to be considered severe in nature. Consequently, there does not appear to be an indication at this time for initiation of dexamethasone per treatment guidance recommendations from [facility]'s Covid Treatment Guidelines.   It is important to note that COVID-19 infection does not represent the admission diagnosis for this hospitalization, and that the patient is not being hospitalized specifically for additional evaluation and management of COVID-19. However, given that there is evidence that initiation of remdesivir early in the course of infection can decrease the likelihood of progression of the severity of COVID-19 in those that are considered to be at high risk for a more complicated course of the infection, it is important to evaluate  if this patient meets criteria for a brief course of remdesivir on this basis.   In the setting of age greater than 82 as well as comorbidities that include type 2 diabetes mellitus, this patient meets criteria to be considered high risk for a more complicated clinical course of COVID-19 infection, including increased probability for progression of the severity associated with their infectious course. Therefore, even though the patient is not being admitted specifically for further evaluation and management of COVID-19 infection, given that the duration since onset of patient's respiratory symptoms is less than 7 days in the context of the presence of high risk criteria, indications are met for initiation of a 3-day course of remdesivir due to associated benefit of decreased risk for progression of severity of their COVID-19 infection with early initiation of remdesivir in this setting, per treatment guidance recommendations from [facility]'s Covid Treatment Guidelines. Will check CMP to evaluate ALT.of note, the patient has received 2 doses of the Pfizer COVID-19 vaccine, but not the booster, as further detailed above.    Plan: Airborne and contact precautions. Monitor continuous pulse oximetry and monitor on telemetry. prn supplemental O2 to maintain O2 sats greater than or equal to 94%. Proning protocol initiated. PRN albuterol inhaler.  Start remdesivir, as above.  Refraining from initiation of dexamethasone, as above.  Check inflammatory markers (fibrinogen, fibrin derivatives, crp, ferritin, LDH), and trend with repeat values ordered for the morning.. Check serum magnesium and phosphorus levels. Check CMP and CBC in the morning. Flutter valve and incentive spirometry.  Check procalcitonin. In setting of DM2, will start linagliptin 5 mg PO Qdaily for associated mortality benefit.            #) Coronary disease: Status post CABG in 2014, which the patient reports occurred in the state of  [place].  EKG without overt evidence of acute ischemic changes, including no evidence of ST elevation, although no prior EKG available for point of comparison.  Will trend serial troponin is a competitive presenting syncopal work-up.  Chest x-ray shows evidence of prior sternotomy, but otherwise shows no evidence of acute cardiopulmonary process, as above.  She is on a daily baby aspirin at home as well as atorvastatin and losartan.  Denies any recent chest pain.   Plan:  Continue home baby aspirin as well as home Lipitor.  Hold home losartan for now in the setting of presenting syncope, with suspicion for contributory orthostatic hypotension, as above.  Monitor on telemetry.  Add on serum magnesium level.      #) Type 2 diabetes mellitus: On glimepiride as well patient oral hypoglycemic agent.  Denies any use of insulin at home.  Presenting blood sugar per presenting BMP noted to be 209.  Plan: Hold home glimepiride during this hospitalization.  Accu-Cheks before every meal and at bedtime with low-dose sliding scale insulin has been ordered.  Have also ordered daily linagliptin, although the indication for this is on the basis of concomitant presenting COVID-19, as further described above.      #) Essential hypertension: Documented history of such for which the patient is uncertain as her sole outpatient antihypertensive regimen.  Vital signs in the ED reflect normotensive blood pressures.  In the process of checking orthostatic vital signs, as above.  Plan: Follow for results of orthostatic vital signs, as above.  In the context of pending orthostatic vital signs as well as presenting syncope, will hold home losartan for now.  Close monitoring of ensuing blood pressures via routine vital signs.     DVT prophylaxis: SCDs Code Status: Full code Family Communication: none Disposition Plan: Per Rounding Team Consults called: none  Admission status: Observation; med telemetry    Of note,  this patient was added by me to the following Admit List/Treatment Team: armcadmits.      PLEASE NOTE THAT DRAGON DICTATION SOFTWARE WAS USED IN THE CONSTRUCTION OF THIS NOTE.   [facility] Pager [phone] From 12PM - 12AM  Otherwise, please contact night-coverage  [email or web address] Password [facility]   06/12/2020, 7:14 PM

## 2020-06-12 NOTE — ED Notes (Signed)
Date and time results received: 06/12/20 1700 (use smartphrase ".now" to insert current time)  Test: SARS coronavirus Critical Value: Positive  Name of Provider Notified: Dr. Cheri Fowler  Orders Received? Or Actions Taken?: ER provider notified

## 2020-06-13 DIAGNOSIS — E119 Type 2 diabetes mellitus without complications: Secondary | ICD-10-CM | POA: Diagnosis not present

## 2020-06-13 DIAGNOSIS — R55 Syncope and collapse: Secondary | ICD-10-CM | POA: Diagnosis not present

## 2020-06-13 DIAGNOSIS — U071 COVID-19: Secondary | ICD-10-CM | POA: Diagnosis not present

## 2020-06-13 LAB — COMPREHENSIVE METABOLIC PANEL
ALT: 46 U/L — ABNORMAL HIGH (ref 0–44)
AST: 47 U/L — ABNORMAL HIGH (ref 15–41)
Albumin: 3.8 g/dL (ref 3.5–5.0)
Alkaline Phosphatase: 75 U/L (ref 38–126)
Anion gap: 9 (ref 5–15)
BUN: 21 mg/dL (ref 8–23)
CO2: 22 mmol/L (ref 22–32)
Calcium: 8.7 mg/dL — ABNORMAL LOW (ref 8.9–10.3)
Chloride: 108 mmol/L (ref 98–111)
Creatinine, Ser: 0.98 mg/dL (ref 0.44–1.00)
GFR, Estimated: 59 mL/min — ABNORMAL LOW (ref >60)
Glucose, Bld: 138 mg/dL — ABNORMAL HIGH (ref 70–99)
Potassium: 3.6 mmol/L (ref 3.5–5.1)
Sodium: 139 mmol/L (ref 135–145)
Total Bilirubin: 0.5 mg/dL (ref 0.3–1.2)
Total Protein: 6.5 g/dL (ref 6.5–8.1)

## 2020-06-13 LAB — CBC WITH DIFFERENTIAL/PLATELET
Abs Immature Granulocytes: 0.01 10*3/uL (ref 0.00–0.07)
Basophils Absolute: 0 10*3/uL (ref 0.0–0.1)
Basophils Relative: 1 %
Eosinophils Absolute: 0.2 10*3/uL (ref 0.0–0.5)
Eosinophils Relative: 5 %
HCT: 37.2 % (ref 36.0–46.0)
Hemoglobin: 13.3 g/dL (ref 12.0–15.0)
Immature Granulocytes: 0 %
Lymphocytes Relative: 32 %
Lymphs Abs: 1.3 10*3/uL (ref 0.7–4.0)
MCH: 29.5 pg (ref 26.0–34.0)
MCHC: 35.8 g/dL (ref 30.0–36.0)
MCV: 82.5 fL (ref 80.0–100.0)
Monocytes Absolute: 0.5 10*3/uL (ref 0.1–1.0)
Monocytes Relative: 12 %
Neutro Abs: 2 10*3/uL (ref 1.7–7.7)
Neutrophils Relative %: 50 %
Platelets: 157 10*3/uL (ref 150–400)
RBC: 4.51 MIL/uL (ref 3.87–5.11)
RDW: 12.3 % (ref 11.5–15.5)
WBC: 4 10*3/uL (ref 4.0–10.5)
nRBC: 0 % (ref 0.0–0.2)

## 2020-06-13 LAB — D-DIMER, QUANTITATIVE: D-Dimer, Quant: 0.55 ug/mL-FEU — ABNORMAL HIGH (ref 0.00–0.50)

## 2020-06-13 LAB — C-REACTIVE PROTEIN
CRP: 0.8 mg/dL (ref <1.0)
CRP: 0.6 mg/dL (ref <1.0)

## 2020-06-13 LAB — FERRITIN: Ferritin: 177 ng/mL (ref 11–307)

## 2020-06-13 LAB — PHOSPHORUS: Phosphorus: 4 mg/dL (ref 2.5–4.6)

## 2020-06-13 LAB — GLUCOSE, CAPILLARY
Glucose-Capillary: 129 mg/dL — ABNORMAL HIGH (ref 70–99)
Glucose-Capillary: 261 mg/dL — ABNORMAL HIGH (ref 70–99)

## 2020-06-13 LAB — TROPONIN I (HIGH SENSITIVITY)
Troponin I (High Sensitivity): 7 ng/L (ref <18)
Troponin I (High Sensitivity): 7 ng/L (ref <18)

## 2020-06-13 LAB — MAGNESIUM: Magnesium: 1.9 mg/dL (ref 1.7–2.4)

## 2020-06-13 LAB — LACTATE DEHYDROGENASE: LDH: 152 U/L (ref 98–192)

## 2020-06-13 LAB — PROCALCITONIN: Procalcitonin: <0.1 ng/mL

## 2020-06-13 LAB — FIBRINOGEN: Fibrinogen: 402 mg/dL (ref 210–475)

## 2020-06-13 NOTE — Discharge Summary (Signed)
Physician Discharge Summary  Morgan Rojas KTG:[id number] DOB: Jan 27, 1941 DOA: 06/12/2020  PCP: Morgan Pink, MD  Admit date: 06/12/2020 Discharge date: 06/13/2020  Admitted From: home  Disposition:  Home   Recommendations for Outpatient Follow-up:  1. Follow up with PCP in 1-2 weeks 2. F/u w/ cardio in 1 week   Home Health: no  Equipment/Devices:   Discharge Condition: stable  CODE STATUS: full  Diet recommendation: Heart Healthy / Carb Modified   Brief/Interim Summary: HPI was taken from Dr. Velia Rojas: Morgan Rojas is a 80 y.o. female with medical history significant for coronary artery disease status post CABG in 2014, type 2 diabetes mellitus, hypertension, vertigo who is admitted to [facility] on 06/12/2020 for further evaluation and management syncope after presenting from home to [facility] ED complaining of 3 episodes of loss of consciousness.  Over the last 5 days, the patient reports experiencing 3 episodes of loss of consciousness, with the first episode occurring on Sunday, 06/09/2020, the second episode occurring yesterday, and the third and final episode occurring this morning.  She reports that all 3 episodes were similar in their symptomatology.  Specifically, she reports each episode of loss of consciousness was preceded by arising from a seated to a standing position 1 to 2 minutes prior to developing dizziness and a sensation that she was going to lose consciousness, for actually losing consciousness.  Duration of loss of consciousness in each case is reportedly less than 30 seconds.  Patient is unsure if she hit her head as a component of these episodes, but denies any ensuing headache, neck pain, neck stiffness, change in vision.   Denies any preceding or ensuing associated chest pain, shortness of breath, diaphoresis, palpitations.  She also denies any associated orthopnea, PND, or peripheral edema.  Denies any associated generalized tonic-clonic  activity, and reports that these episodes were associated with any tongue biting, or loss of bowel/bladder function.  Has never previously experienced a syncopal episode.  She acknowledges a history of vertigo, but reports that the 3 episodes of loss of consciousness that she has experienced over the last 3 days are completely different than prior episodes of vertigo, and denies any recent associated sensation of the room spinning.   Over the last 4 days, she reports diminished appetite resulting in diminished oral intake over that time in the absence of any associated nausea or vomiting.  She notes 2 episodes of loose stool over the last 3 days, in the absence of any associated melena, hematochezia, or abdominal discomfort.   Denies any associated acute focal weakness, acute focal paresthesias, numbness, dysphagia, dysarthria, facial droop.   However, over the last 3 to 4 days, she reports new onset nonproductive cough as well as subjective fever in the absence of chills, rigors, or generalized myalgias.  Denies any associated neck stiffness, rhinitis, rhinorrhea, sore throat, wheezing, shortness of breath.  Denies any recent traveling or known COVID-19 exposures.  She denies any recent dysuria, gross hematuria, or change in urinary urgency/frequency.  She has received 2 doses of the Stark COVID-19 vaccine in April and May 2021, but has not received the booster.  Reports that she is a lifelong non-smoker, denies any known chronic underlying pulmonary pathology.  Past medical history notable for coronary artery disease status post CABG in 2014, which she states was performed in the state of [place].  She is on a daily baby aspirin, but otherwise on no blood thinning agents at home.  She also acknowledges a history of  type 2 diabetes mellitus, for which she is on glimepiride as her sole outpatient oral hypoglycemic agent.  Denies taking insulin as an outpatient.   Hospital Course from Dr. Jimmye Rojas  06/13/20: Pt presented after syncopal episodes likely secondary to orthostatic hypotension. Pt's orthostatic vitals were not taken or documented on day of admission but the following day, the orthostatic vitals were neg. Pt had already completed the IVFs x 12 hours by the time orthostatic vitals were taken. Pt denies any dizziness/lightheadedness, palpitations, chest pain, shortness of breath, nausea, vomiting, abd pain or diarrhea. OT evaluated the pt and pt did not need any OT f/u. Pt will f/u outpatient w/ cardiologist w/in 1 week for cardiac workup. Pt verbalized her understanding. Of note, pt was found to have COVID19 infection but CXR was neg for pneumonia. Pt did not require supplemental oxygen. For more information, please see other notes.   Discharge Diagnoses:  Principal Problem:   Syncope Active Problems:   Syncope and collapse   COVID-19 virus infection   DM2 (diabetes mellitus, type 2) (HCC)   CAD (coronary artery disease)  Syncope: etiology unclear, possible orthostatic hypotension. Orthostatic vitals ordered. Continue on IVFs  Asymptomatic pyuria: urine cx is pending.   COVID19 infection: nonproductive cough but CXR was neg for pneumonia. No indication currently for steroids. Continue on IV remdesivir. Continue on airborne & contact precautions   CAD: s/p CABG in 2014. Continue on aspirin, statin.   DM2: continue on home dose of glimepiride. Continue on SSI w/ accuchecks   HTN: will hold home dose of losartan secondary to possible orthostatic hypotension   Discharge Instructions  Discharge Instructions    Diet - low sodium heart healthy   Complete by: As directed    Diet Carb Modified   Complete by: As directed    Discharge instructions   Complete by: As directed    F/u w/ PCP in 1-2 weeks. F/u w/ your cardiologist in 1 week   Increase activity slowly   Complete by: As directed      Allergies as of 06/13/2020      Reactions   Sulfa Antibiotics Swelling    Cephalexin Other (See Comments)   Other reaction(s): Unknown   Lisinopril Other (See Comments)   Other reaction(s): Unknown   Metformin And Related    Nabumetone Other (See Comments)   Other reaction(s): Unknown   Penicillins    Simvastatin    Doxycycline Nausea Only, Nausea And Vomiting   Other reaction(s): Vomiting   Levofloxacin Itching      Medication List    TAKE these medications   aspirin 81 MG chewable tablet Chew 81 mg by mouth daily.   atorvastatin 10 MG tablet Commonly known as: LIPITOR Take 10 mg by mouth daily.   fluticasone 50 MCG/ACT nasal spray Commonly known as: FLONASE Place 2 sprays into both nostrils daily.   glimepiride 4 MG tablet Commonly known as: AMARYL Take 4 mg by mouth daily with breakfast.   losartan 25 MG tablet Commonly known as: COZAAR Take 25 mg by mouth daily.   nitroGLYCERIN 0.4 MG SL tablet Commonly known as: NITROSTAT Place 0.4 mg under the tongue every 5 (five) minutes as needed for chest pain.   sertraline 25 MG tablet Commonly known as: ZOLOFT Take 25 mg by mouth daily.       Allergies  Allergen Reactions  . Sulfa Antibiotics Swelling  . Cephalexin Other (See Comments)    Other reaction(s): Unknown   . Lisinopril Other (See  Comments)    Other reaction(s): Unknown   . Metformin And Related   . Nabumetone Other (See Comments)    Other reaction(s): Unknown   . Penicillins   . Simvastatin   . Doxycycline Nausea Only and Nausea And Vomiting    Other reaction(s): Vomiting   . Levofloxacin Itching    Consultations:     Procedures/Studies: CT Head Wo Contrast  Result Date: 06/12/2020 CLINICAL DATA:  Altered mental status, dizziness. EXAM: CT HEAD WITHOUT CONTRAST TECHNIQUE: Contiguous axial images were obtained from the base of the skull through the vertex without intravenous contrast. COMPARISON:  None. FINDINGS: Brain: No evidence of acute infarction, hemorrhage, hydrocephalus, extra-axial collection or mass  lesion/mass effect. Vascular: No hyperdense vessel or unexpected calcification. Skull: Normal. Negative for fracture or focal lesion. Sinuses/Orbits: No acute finding. Other: None. IMPRESSION: Normal head CT. Electronically Signed   By: Marijo Conception M.D.   On: 06/12/2020 16:42   DG Chest Port 1 View  Result Date: 06/12/2020 CLINICAL DATA:  Syncope EXAM: PORTABLE CHEST 1 VIEW COMPARISON:  Report 06/22/2018 FINDINGS: Post sternotomy changes with fracture through the first and second sternal wires. Wire fragments over the right upper paramediastinal region. No focal opacity or pleural effusion. Borderline cardiomegaly. No pneumothorax. IMPRESSION: No active disease. Borderline cardiomegaly. Prior sternotomy with apparent sternal wire fragments projecting over the right upper paramediastinal area. Electronically Signed   By: Donavan Foil M.D.   On: 06/12/2020 16:54        Subjective: pt denies any complaints    Discharge Exam: Vitals:   06/13/20 0736 06/13/20 1151  BP: 117/63 128/66  Pulse: 71 64  Resp: 18 18  Temp: 97.8 F (36.6 C) 97.8 F (36.6 C)  SpO2: 99% 100%   Vitals:   06/13/20 0500 06/13/20 0519 06/13/20 0736 06/13/20 1151  BP:  (!) 103/53 117/63 128/66  Pulse:  66 71 64  Resp:  16 18 18   Temp:  97.9 F (36.6 C) 97.8 F (36.6 C) 97.8 F (36.6 C)  TempSrc:  Oral    SpO2:  98% 99% 100%  Weight: 72.6 kg     Height:        General: Pt is alert, awake, not in acute distress Cardiovascular:  S1/S2 +, no rubs, no gallops Respiratory: CTA bilaterally, no wheezing, no rhonchi Abdominal: Soft, NT, ND, bowel sounds + Extremities: no edema, no cyanosis    The results of significant diagnostics from this hospitalization (including imaging, microbiology, ancillary and laboratory) are listed below for reference.     Microbiology: Recent Results (from the past 240 hour(s))  Resp Panel by RT-PCR (Flu A&B, Covid) Nasopharyngeal Swab     Status: Abnormal   Collection Time:  06/12/20  3:59 PM   Specimen: Nasopharyngeal Swab; Nasopharyngeal(NP) swabs in vial transport medium  Result Value Ref Range Status   SARS Coronavirus 2 by RT PCR POSITIVE (A) NEGATIVE Final    Comment: RESULT CALLED TO, READ BACK BY AND VERIFIED WITH: ASHTON PETERS 06/12/20 1656 KLW (NOTE) SARS-CoV-2 target nucleic acids are DETECTED.  The SARS-CoV-2 RNA is generally detectable in upper respiratory specimens during the acute phase of infection. Positive results are indicative of the presence of the identified virus, but do not rule out bacterial infection or co-infection with other pathogens not detected by the test. Clinical correlation with patient history and other diagnostic information is necessary to determine patient infection status. The expected result is Negative.  Fact Sheet for Patients: EntrepreneurPulse.com.au  Fact Sheet for  Healthcare Providers: IncredibleEmployment.be  This test is not yet approved or cleared by the [place] and  has been authorized for detection and/or diagnosis of SARS-CoV-2 by FDA under an Emergency Use Authorization (EUA).  This EUA will remain in effect (meaning this test can be [place] ed) for the duration of  the COVID-19 declaration under Section 564(b)(1) of the Act, 21 U.S.C. section 360bbb-3(b)(1), unless the authorization is terminated or revoked sooner.     Influenza A by PCR NEGATIVE NEGATIVE Final   Influenza B by PCR NEGATIVE NEGATIVE Final    Comment: (NOTE) The Xpert Xpress SARS-CoV-2/FLU/RSV plus assay is intended as an aid in the diagnosis of influenza from Nasopharyngeal swab specimens and should not be used as a sole basis for treatment. Nasal washings and aspirates are unacceptable for Xpert Xpress SARS-CoV-2/FLU/RSV testing.  Fact Sheet for Patients: EntrepreneurPulse.com.au  Fact Sheet for Healthcare  Providers: IncredibleEmployment.be  This test is not yet approved or cleared by the [place] FDA and has been authorized for detection and/or diagnosis of SARS-CoV-2 by FDA under an Emergency Use Authorization (EUA). This EUA will remain in effect (meaning this test can be used) for the duration of the COVID-19 declaration under Section 564(b)(1) of the Act, 21 U.S.C. section 360bbb-3(b)(1), unless the authorization is terminated or revoked.  Performed at [facility], [address] [place], [place] [address]      Labs: BNP (last 3 results) No results for input(s): BNP in the last 8760 hours. Basic Metabolic Panel: Recent Labs  Lab 06/12/20 1323 06/12/20 1947 06/13/20 0437  NA 133*  --  139  K 4.0  --  3.6  CL 99  --  108  CO2 23  --  22  GLUCOSE 209*  --  138*  BUN 21  --  21  CREATININE 1.19*  --  0.98  CALCIUM 9.1  --  8.7*  MG  --  1.9 1.9  PHOS  --   --  4.0   Liver Function Tests: Recent Labs  Lab 06/13/20 0437  AST 47*  ALT 46*  ALKPHOS 75  BILITOT 0.5  PROT 6.5  ALBUMIN 3.8   No results for input(s): LIPASE, AMYLASE in the last 168 hours. No results for input(s): AMMONIA in the last 168 hours. CBC: Recent Labs  Lab 06/12/20 1323 06/13/20 0437  WBC 4.6 4.0  NEUTROABS  --  2.0  HGB 14.8 13.3  HCT 42.9 37.2  MCV 84.1 82.5  PLT 182 157   Cardiac Enzymes: Recent Labs  Lab 06/12/20 2151  CKTOTAL 118   BNP: Invalid input(s): POCBNP CBG: Recent Labs  Lab 06/13/20 0731 06/13/20 1115  GLUCAP 129* 261*   D-Dimer Recent Labs    06/12/20 2151 06/13/20 0437  DDIMER 0.55* 0.55*   Hgb A1c No results for input(s): HGBA1C in the last 72 hours. Lipid Profile No results for input(s): CHOL, HDL, LDLCALC, TRIG, CHOLHDL, LDLDIRECT in the last 72 hours. Thyroid function studies No results for input(s): TSH, T4TOTAL, T3FREE, THYROIDAB in the last 72 hours.  Invalid input(s): FREET3 Anemia work up Recent Labs     06/12/20 2151 06/13/20 0437  FERRITIN 174 177   Urinalysis    Component Value Date/Time   COLORURINE YELLOW (A) 06/12/2020 1323   APPEARANCEUR HAZY (A) 06/12/2020 1323   LABSPEC 1.011 06/12/2020 1323   PHURINE 5.0 06/12/2020 1323   GLUCOSEU 50 (A) 06/12/2020 1323   HGBUR SMALL (A) 06/12/2020 1323   BILIRUBINUR NEGATIVE 06/12/2020  [place] 06/12/2020 [place] 06/12/2020 1323   NITRITE NEGATIVE 06/12/2020 1323   LEUKOCYTESUR LARGE (A) 06/12/2020 1323   Sepsis Labs Invalid input(s): PROCALCITONIN,  WBC,  LACTICIDVEN Microbiology Recent Results (from the past 240 hour(s))  Resp Panel by RT-PCR (Flu A&B, Covid) Nasopharyngeal Swab     Status: Abnormal   Collection Time: 06/12/20  3:59 PM   Specimen: Nasopharyngeal Swab; Nasopharyngeal(NP) swabs in vial transport medium  Result Value Ref Range Status   SARS Coronavirus 2 by RT PCR POSITIVE (A) NEGATIVE Final    Comment: RESULT CALLED TO, READ BACK BY AND VERIFIED WITH: ASHTON PETERS 06/12/20 1656 KLW (NOTE) SARS-CoV-2 target nucleic acids are DETECTED.  The SARS-CoV-2 RNA is generally detectable in upper respiratory specimens during the acute phase of infection. Positive results are indicative of the presence of the identified virus, but do not rule out bacterial infection or co-infection with other pathogens not detected by the test. Clinical correlation with patient history and other diagnostic information is necessary to determine patient infection status. The expected result is Negative.  Fact Sheet for Patients: EntrepreneurPulse.com.au  Fact Sheet for Healthcare Providers: IncredibleEmployment.be  This test is not yet approved or cleared by the [place] FDA and  has been authorized for detection and/or diagnosis of SARS-CoV-2 by FDA under an Emergency Use Authorization (EUA).  This EUA will remain in effect (meaning this test can be [place] ed)  for the duration of  the COVID-19 declaration under Section 564(b)(1) of the Act, 21 U.S.C. section 360bbb-3(b)(1), unless the authorization is terminated or revoked sooner.     Influenza A by PCR NEGATIVE NEGATIVE Final   Influenza B by PCR NEGATIVE NEGATIVE Final    Comment: (NOTE) The Xpert Xpress SARS-CoV-2/FLU/RSV plus assay is intended as an aid in the diagnosis of influenza from Nasopharyngeal swab specimens and should not be used as a sole basis for treatment. Nasal washings and aspirates are unacceptable for Xpert Xpress SARS-CoV-2/FLU/RSV testing.  Fact Sheet for Patients: EntrepreneurPulse.com.au  Fact Sheet for Healthcare Providers: IncredibleEmployment.be  This test is not yet approved or cleared by the [place] FDA and has been authorized for detection and/or diagnosis of SARS-CoV-2 by FDA under an Emergency Use Authorization (EUA). This EUA will remain in effect (meaning this test can be used) for the duration of the COVID-19 declaration under Section 564(b)(1) of the Act, 21 U.S.C. section 360bbb-3(b)(1), unless the authorization is terminated or revoked.  Performed at [facility], [address] [place], [place] [address]      Time coordinating discharge: Over 30 minutes  SIGNED:   Wyvonnia Dusky, MD  [facility] 06/13/2020, 12:12 PM Pager   If 7PM-7AM, please contact night-coverage

## 2020-06-13 NOTE — Evaluation (Signed)
Occupational Therapy Evaluation Patient Details Name: Morgan Rojas MRN: [id number] DOB: 07-09-1940 Today's Date: 06/13/2020    History of Present Illness 80 y.o. female with a history of type 2 diabetes and CAD who presents for 3 episodes of syncope over the last 2 days.  Patient states that these episodes have come when she is doing her normal activity around the house.  Patient denies any orthostatic change in her lightheadedness.  Patient states she has been having some diarrhea as well as woke up with sweats in the middle of the night last night.  Patient denies any head trauma during these falls.  Patient currently denies any vision changes, tinnitus, difficulty speaking, facial droop, sore throat, chest pain, shortness of breath, abdominal pain, nausea/vomiting/diarrhea, dysuria, or numbness/paresthesias in any extremity   Clinical Impression   Upon entering the room, pt supine in bed with NT present. Pt is alert and oriented x4 and agreeable to OT intervention. NT starting orthostatic BPs with therapist assisting and all vitals WNLs and documented in flowsheets.Pt asymptomatic with standing. Pt demonstrated self care tasks, functional mobility while maintaining lines and IV pole, and hygiene in standing without physical assistance. Pt reporting feeling at her baseline after completing these tasks. Pt lives with her husband in level entry apartment independently PTA. Pt drives and is very active at baseline. Pt with no skilled OT needs at this time. OT to SIGN OFF. Thank you for this referral.     Follow Up Recommendations  No OT follow up    Equipment Recommendations  None recommended by OT       Precautions / Restrictions Precautions Precautions: Fall      Mobility Bed Mobility Overal bed mobility: Modified Independent             General bed mobility comments: HOB elevated but no physical assist needed    Transfers Overall transfer level: Modified independent Equipment  used: None             General transfer comment: Pt pushes IV pole and manages leads/lines to get to bathroom for toileting needs    Balance Overall balance assessment: Modified Independent                                         ADL either performed or assessed with clinical judgement   ADL Overall ADL's : At baseline;Modified independent                                       General ADL Comments: increased time to complete tasks but manages lines and IV pole independently     Vision Baseline Vision/History: Wears glasses Wears Glasses: At all times Patient Visual Report: No change from baseline              Pertinent Vitals/Pain Pain Assessment: No/denies pain     Hand Dominance Right   Extremity/Trunk Assessment Upper Extremity Assessment Upper Extremity Assessment: Overall WFL for tasks assessed   Lower Extremity Assessment Lower Extremity Assessment: Overall WFL for tasks assessed   Cervical / Trunk Assessment Cervical / Trunk Assessment: Normal   Communication Communication Communication: No difficulties   Cognition Arousal/Alertness: Awake/alert Behavior During Therapy: WFL for tasks assessed/performed Overall Cognitive Status: Within Functional Limits for tasks assessed  Home Living Family/patient expects to be discharged to:: Private residence Living Arrangements: Spouse/significant other Available Help at Discharge: Family;Available 24 hours/day Type of Home: Apartment Home Access: Level entry     Home Layout: One level     Bathroom Shower/Tub: Teacher, early years/pre: Standard     Home Equipment: None          Prior Functioning/Environment Level of Independence: Independent        Comments: still drives, walks at the park, and is just very motivated and active prior.                 OT Goals(Current goals can  be found in the care plan section) Acute Rehab OT Goals Patient Stated Goal: to go home and be with my husband OT Goal Formulation: With patient Potential to Achieve Goals: Good  OT Frequency:      AM-PAC OT "6 Clicks" Daily Activity     Outcome Measure Help from another person eating meals?: None Help from another person taking care of personal grooming?: None Help from another person toileting, which includes using toliet, bedpan, or urinal?: None Help from another person bathing (including washing, rinsing, drying)?: None Help from another person to put on and taking off regular upper body clothing?: None Help from another person to put on and taking off regular lower body clothing?: None 6 Click Score: 24   End of Session Nurse Communication: Mobility status  Activity Tolerance: Patient tolerated treatment well Patient left: in bed;with call bell/phone within reach;with bed alarm set                   Time: 1030-1108 OT Time Calculation (min): 38 min Charges:  OT General Charges $OT Visit: 1 Visit OT Evaluation $OT Eval Low Complexity: 1 Low OT Treatments $Self Care/Home Management : 23-37 mins  Darleen Crocker, MS, OTR/L , CBIS ascom [phone]  06/13/20, 11:23 AM

## 2020-06-13 NOTE — Progress Notes (Signed)
PT Cancellation Note  Patient Details Name: Morgan Rojas MRN: [id number] DOB: 09/07/1940   Cancelled Treatment:    Reason Eval/Treat Not Completed: Other (comment). Upon arrival to room, pt currently discharging with RN present. Observed pt ambulate in room independently and per OT note is at baseline level. No acute PT needs identified at this time. Will cancel order.   Azariah Latendresse 06/13/2020, 2:40 PM  Greggory Stallion, PT, DPT [phone]

## 2020-06-14 LAB — HEMOGLOBIN A1C
Hgb A1c MFr Bld: 7.7 % — ABNORMAL HIGH (ref 4.8–5.6)
Mean Plasma Glucose: 174 mg/dL

## 2020-06-14 LAB — URINE CULTURE

## 2020-11-20 ENCOUNTER — Other Ambulatory Visit: Payer: Self-pay | Admitting: Family Medicine

## 2020-11-20 ENCOUNTER — Other Ambulatory Visit: Payer: Self-pay

## 2020-11-20 ENCOUNTER — Ambulatory Visit
Admission: RE | Admit: 2020-11-20 | Discharge: 2020-11-20 | Disposition: A | Discharge status: Home / Self Care | Payer: Medicare HMO | Source: Ambulatory Visit | Attending: Family Medicine | Admitting: Family Medicine

## 2020-11-20 DIAGNOSIS — M79661 Pain in right lower leg: Secondary | ICD-10-CM | POA: Diagnosis present

## 2020-11-20 DIAGNOSIS — M7989 Other specified soft tissue disorders: Secondary | ICD-10-CM | POA: Insufficient documentation

## 2021-01-08 ENCOUNTER — Other Ambulatory Visit: Payer: Self-pay | Admitting: Family Medicine

## 2021-01-08 DIAGNOSIS — Z1231 Encounter for screening mammogram for malignant neoplasm of breast: Secondary | ICD-10-CM

## 2021-01-22 ENCOUNTER — Ambulatory Visit
Admission: RE | Admit: 2021-01-22 | Discharge: 2021-01-22 | Disposition: A | Discharge status: Home / Self Care | Payer: Medicare HMO | Source: Ambulatory Visit | Attending: Family Medicine | Admitting: Family Medicine

## 2021-01-22 ENCOUNTER — Other Ambulatory Visit: Payer: Self-pay

## 2021-01-22 DIAGNOSIS — Z1231 Encounter for screening mammogram for malignant neoplasm of breast: Secondary | ICD-10-CM | POA: Diagnosis present

## 2021-09-29 ENCOUNTER — Ambulatory Visit (INDEPENDENT_AMBULATORY_CARE_PROVIDER_SITE_OTHER): Payer: Medicare HMO

## 2021-09-29 ENCOUNTER — Ambulatory Visit: Payer: Medicare HMO | Admitting: Podiatry

## 2021-09-29 ENCOUNTER — Encounter: Payer: Self-pay | Admitting: Podiatry

## 2021-09-29 DIAGNOSIS — M722 Plantar fascial fibromatosis: Secondary | ICD-10-CM

## 2021-09-29 DIAGNOSIS — B353 Tinea pedis: Secondary | ICD-10-CM | POA: Diagnosis not present

## 2021-09-29 DIAGNOSIS — I10 Essential (primary) hypertension: Secondary | ICD-10-CM | POA: Insufficient documentation

## 2021-09-29 MED ORDER — TRIAMCINOLONE ACETONIDE 40 MG/ML IJ SUSP
20.0000 mg | Freq: Once | INTRAMUSCULAR | Status: AC
  Administered 2021-09-29: 20 mg

## 2021-09-29 MED ORDER — KETOCONAZOLE 2 % EX CREA: 1.0000 "application " | TOPICAL_CREAM | Freq: Two times a day (BID) | CUTANEOUS | 2 refills | Status: AC

## 2021-09-29 NOTE — Progress Notes (Signed)
Subjective:  Patient ID: Morgan Rojas, female    DOB: May 30, 1940,  MRN: [id number] HPI Chief Complaint  Patient presents with   Foot Pain    Plantar heel right - aching x few months, AM pain, history of PF-had an injection before, also callused area that is tender sub 5th MPJ right    Diabetes    Last a1c is unknown, but morning glucose is running 150's   New Patient (Initial Visit)    81 y.o. female presents with the above complaint.   ROS: Denies fever chills nausea vomiting muscle aches pains calf pain back pain chest pain shortness of breath.  Last hemoglobin A1c was a 7.7 and her circulating blood sugar this morning was 150 mg/dL.  Past Medical History:  Diagnosis Date   CAD (coronary artery disease)    s/p CABG in 2014 in the state of [place]   Cancer [facility])    skin   Diabetes mellitus without complication ([place])    Heart disease    Past Surgical History:  Procedure Laterality Date   CARDIAC SURGERY     carpel tunnel     STENT REMOVAL      Current Outpatient Medications:    ketoconazole (NIZORAL) 2 % cream, Apply 1 application. topically 2 (two) times daily., Disp: 15 g, Rfl: 2   aspirin 81 MG chewable tablet, Chew 81 mg by mouth daily., Disp: , Rfl:    atorvastatin (LIPITOR) 10 MG tablet, Take 10 mg by mouth daily., Disp: , Rfl:    fluticasone (FLONASE) 50 MCG/ACT nasal spray, Place 2 sprays into both nostrils daily., Disp: , Rfl:    gabapentin (NEURONTIN) 100 MG capsule, Take 100 mg by mouth at bedtime., Disp: , Rfl:    glimepiride (AMARYL) 4 MG tablet, Take 4 mg by mouth daily with breakfast., Disp: , Rfl:    losartan (COZAAR) 25 MG tablet, Take 25 mg by mouth daily., Disp: , Rfl:    Multiple Vitamins tablet, , Disp: , Rfl:    nitroGLYCERIN (NITROSTAT) 0.4 MG SL tablet, Place 0.4 mg under the tongue every 5 (five) minutes as needed for chest pain., Disp: , Rfl:    ONETOUCH VERIO test strip, SMARTSIG:1 Strip(s) Via Meter, Disp: , Rfl:    sertraline (ZOLOFT) 25 MG  tablet, Take 25 mg by mouth daily., Disp: , Rfl:   Allergies  Allergen Reactions   Sulfa Antibiotics Swelling   Cephalexin Other (See Comments)    Other reaction(s): Unknown    Lisinopril Other (See Comments)    Other reaction(s): Unknown    Metformin And Related    Nabumetone Other (See Comments)    Other reaction(s): Unknown    Other     Other reaction(s): Unknown Non-specific allergen    Penicillins    Simvastatin    Doxycycline Nausea Only and Nausea And Vomiting    Other reaction(s): Vomiting    Levofloxacin Itching   Review of Systems Objective:  There were no vitals filed for this visit.  General: Well developed, nourished, in no acute distress, alert and oriented x3   Dermatological: Skin is warm, dry and supple bilateral. Nails x 10 are well maintained; remaining integument appears unremarkable at this time. There are no open sores, no preulcerative lesions, no rash or signs of infection present.  Vascular: Dorsalis Pedis artery and Posterior Tibial artery pedal pulses are 2/4 bilateral with immedate capillary fill time. Pedal hair growth present. No varicosities and no lower extremity edema present bilateral.   Neruologic: Grossly  intact via light touch bilateral. Vibratory intact via tuning fork bilateral. Protective threshold with Semmes Wienstein monofilament intact to all pedal sites bilateral. Patellar and Achilles deep tendon reflexes 2+ bilateral. No Babinski or clonus noted bilateral.   Musculoskeletal: No gross boney pedal deformities bilateral. No pain, crepitus, or limitation noted with foot and ankle range of motion bilateral. Muscular strength 5/5 in all groups tested bilateral.  She has pain on palpation plantar calcaneal tubercle central and lateral right heel.  Gait: Unassisted, Nonantalgic.    Radiographs:  Radiographs taken today demonstrate an osseously mature individual right foot demonstrates plantar distally oriented calcaneal heel spur with  soft tissue increase in density plantar fashion calcaneal insertion site.  Mild plantar fasciitis most likely.  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  Assessment & Plan:   Assessment: Planter fasciitis right.  Tinea pedis right.  Plan: Ketoconazole twice daily to the foot plantar medial lateral margins.  Injected the right heel today 20 mg Kenalog 5 mg Marcaine point of maximal tenderness.  She tolerated the procedure well.  She was given both oral and written home-going instructions for stretching exercises and appropriate shoe gear.  I will follow-up with her on an as-needed basis.     Deshan Hemmelgarn T. [place], [place]

## 2022-02-08 DIAGNOSIS — I2510 Atherosclerotic heart disease of native coronary artery without angina pectoris: Secondary | ICD-10-CM | POA: Insufficient documentation

## 2022-02-08 DIAGNOSIS — I214 Non-ST elevation (NSTEMI) myocardial infarction: Secondary | ICD-10-CM | POA: Insufficient documentation

## 2022-02-08 DIAGNOSIS — Z951 Presence of aortocoronary bypass graft: Secondary | ICD-10-CM | POA: Insufficient documentation

## 2022-02-08 DIAGNOSIS — I200 Unstable angina: Secondary | ICD-10-CM | POA: Insufficient documentation

## 2022-07-14 IMAGING — MG MM DIGITAL SCREENING BILAT W/ TOMO AND CAD
8 series · 8 of 24 positions shown · non-contrast
Comparison: Previous exam(s).

CLINICAL DATA: Screening.

EXAM:
DIGITAL SCREENING BILATERAL MAMMOGRAM WITH TOMOSYNTHESIS AND CAD
TECHNIQUE: Bilateral screening digital craniocaudal and mediolateral oblique
mammograms were obtained. Bilateral screening digital breast
tomosynthesis was performed. The images were evaluated with
computer-aided detection.

[R MLO synth-2D]
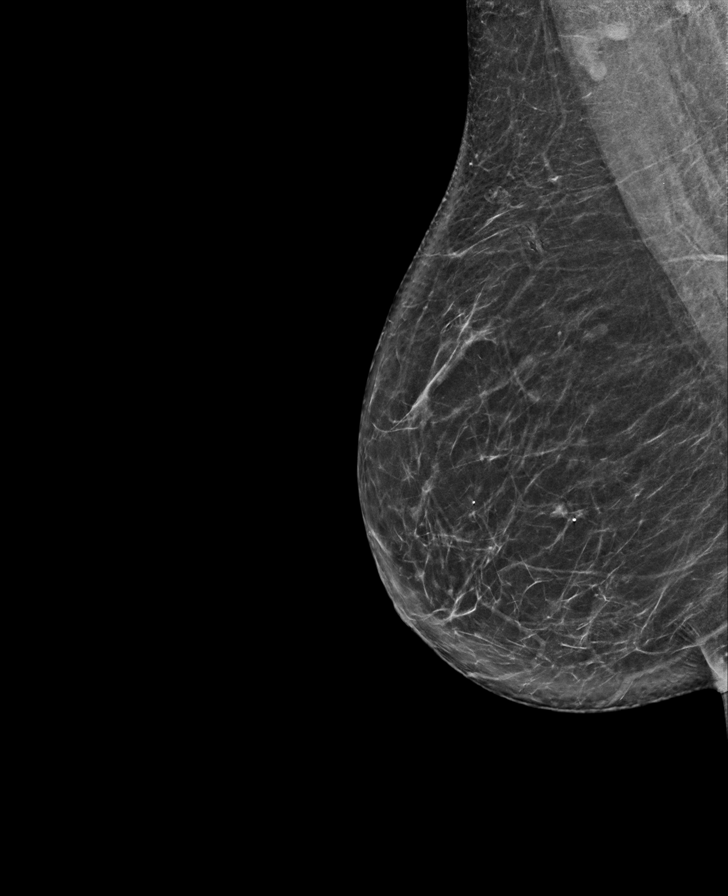

[L CC synth-2D]
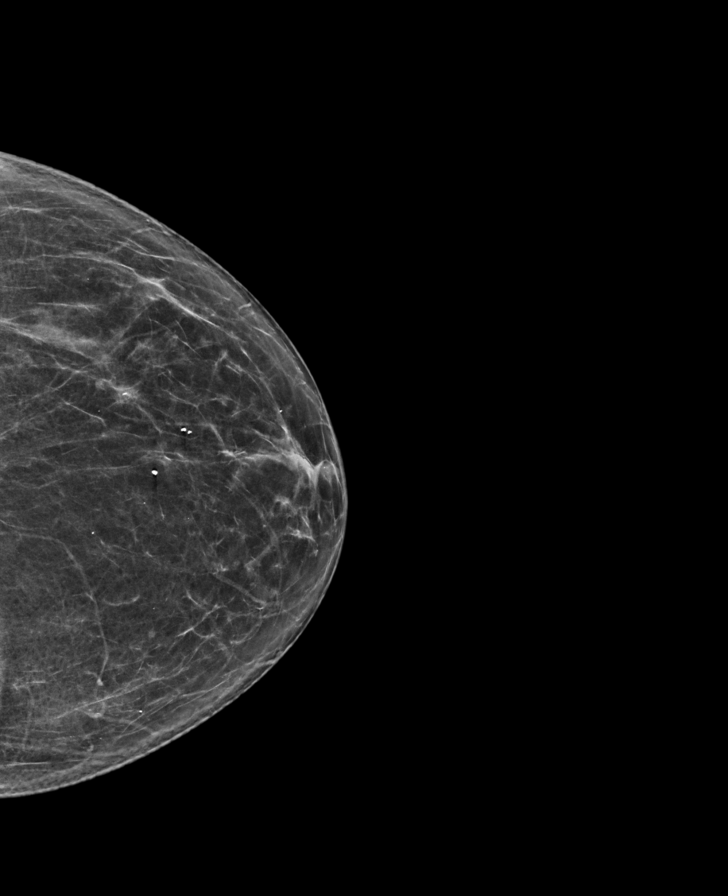

[L MLO synth-2D]
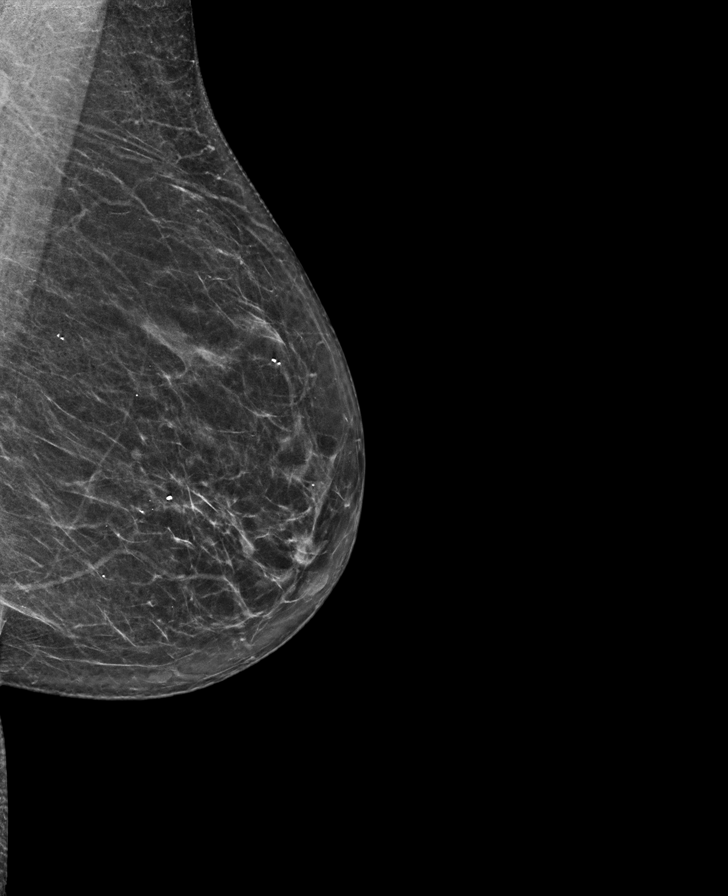

[R CC synth-2D]
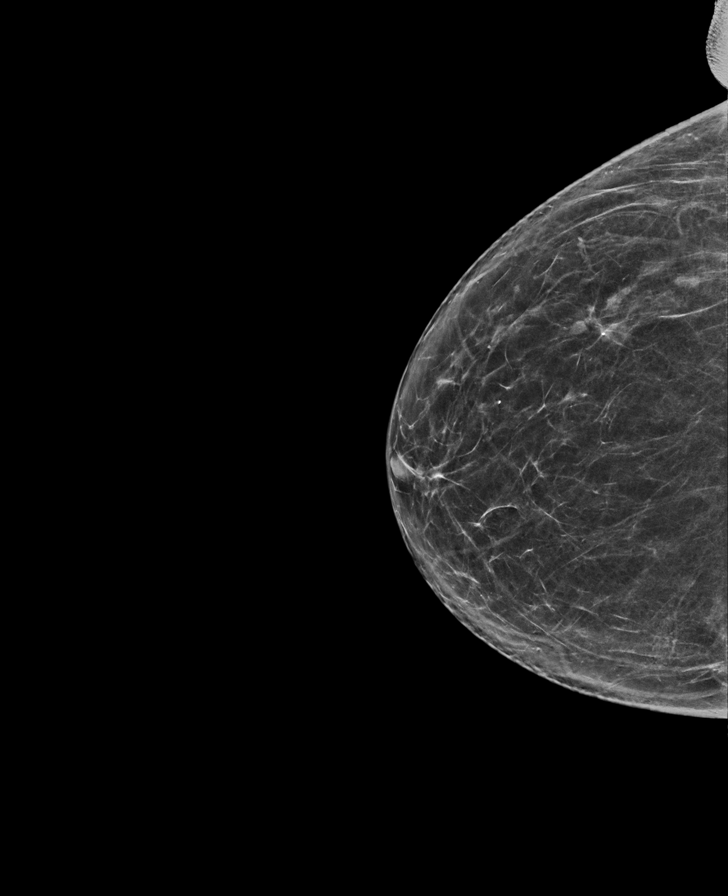

[L MLO tomo · tomo slice 33/66.0]
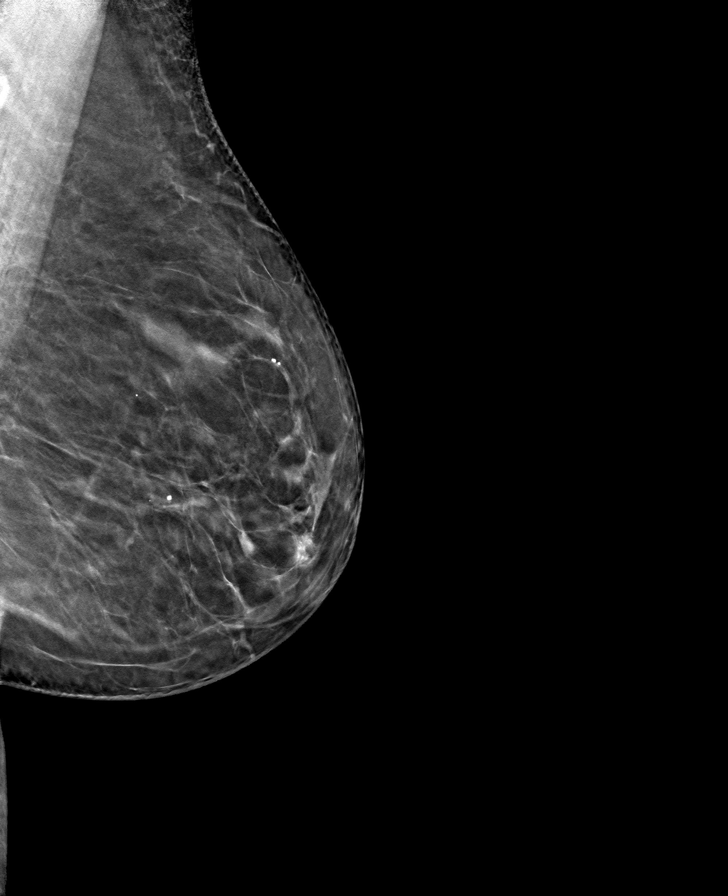

[R CC tomo · tomo slice 34/67.0]
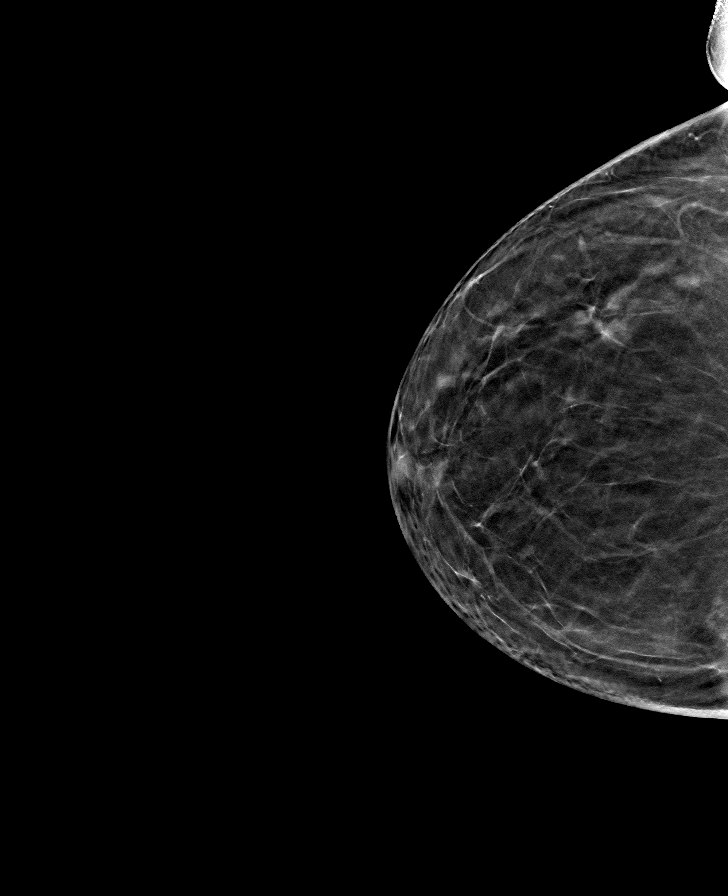

[R MLO tomo · tomo slice 40/79.0]
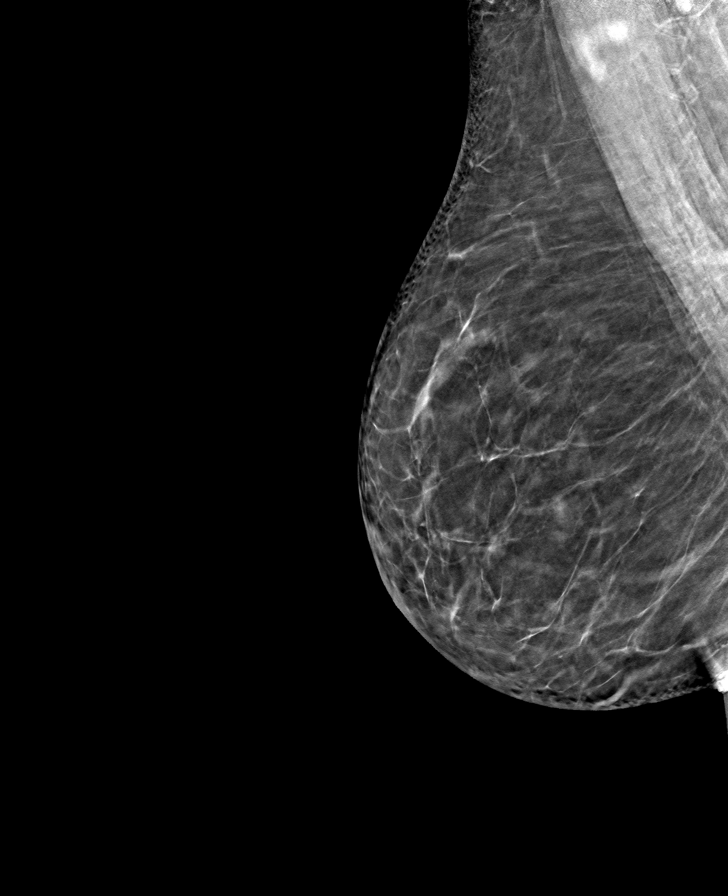

[L CC tomo · tomo slice 33/64.0]
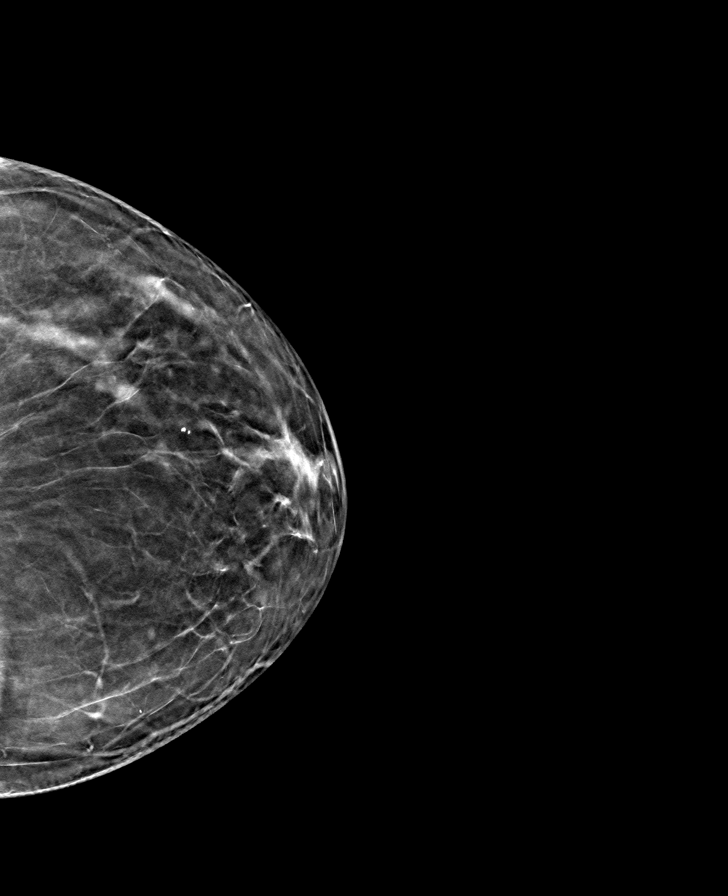

[8 of 24 positions shown; findings below may reference images not displayed]

ACR Breast Density Category b: There are scattered areas of
fibroglandular density.
FINDINGS: There are no findings suspicious for malignancy.
IMPRESSION: No mammographic evidence of malignancy. A result letter of this
screening mammogram will be mailed directly to the patient.

RECOMMENDATION:
Screening mammogram in one year. (Code:51-O-LD2)

BI-RADS CATEGORY  1: Negative.

## 2022-09-22 DIAGNOSIS — R748 Abnormal levels of other serum enzymes: Secondary | ICD-10-CM | POA: Insufficient documentation

## 2023-03-01 ENCOUNTER — Ambulatory Visit: Payer: Medicare HMO | Admitting: Podiatry

## 2023-03-01 ENCOUNTER — Encounter: Payer: Self-pay | Admitting: Podiatry

## 2023-03-01 ENCOUNTER — Ambulatory Visit (INDEPENDENT_AMBULATORY_CARE_PROVIDER_SITE_OTHER): Payer: Medicare HMO

## 2023-03-01 DIAGNOSIS — G5781 Other specified mononeuropathies of right lower limb: Secondary | ICD-10-CM

## 2023-03-01 DIAGNOSIS — M778 Other enthesopathies, not elsewhere classified: Secondary | ICD-10-CM | POA: Diagnosis not present

## 2023-03-01 MED ORDER — TRIAMCINOLONE ACETONIDE 40 MG/ML IJ SUSP
20.0000 mg | Freq: Once | INTRAMUSCULAR | Status: AC
  Administered 2023-03-01: 20 mg

## 2023-03-01 NOTE — Progress Notes (Signed)
She presents today states that I got burned the toes in the right foot over the past couple of months she states that is really sore right here she points to the third interspace of the right foot.  Objective: Vital signs are stable she is alert and oriented x 3.  Pulses are palpable.  Hammertoe deformities with distal clavi are noted.  She has a palpable Mulder's click to the third interdigital space of the right foot.  Assessment: Pain in limb secondary to neuroma third interdigital space right.  Plan: I injected the neuroma today with 10 mg Kenalog 5 mg Marcaine point maximal tenderness.

## 2023-08-09 ENCOUNTER — Emergency Department

## 2023-08-09 ENCOUNTER — Other Ambulatory Visit: Payer: Self-pay

## 2023-08-09 ENCOUNTER — Emergency Department
Admission: EM | Admit: 2023-08-09 | Discharge: 2023-08-09 | Disposition: A | Discharge status: Home / Self Care | Attending: Emergency Medicine | Admitting: Emergency Medicine

## 2023-08-09 DIAGNOSIS — E119 Type 2 diabetes mellitus without complications: Secondary | ICD-10-CM | POA: Diagnosis not present

## 2023-08-09 DIAGNOSIS — Y92008 Other place in unspecified non-institutional (private) residence as the place of occurrence of the external cause: Secondary | ICD-10-CM | POA: Diagnosis not present

## 2023-08-09 DIAGNOSIS — W109XXA Fall (on) (from) unspecified stairs and steps, initial encounter: Secondary | ICD-10-CM | POA: Insufficient documentation

## 2023-08-09 DIAGNOSIS — S52592A Other fractures of lower end of left radius, initial encounter for closed fracture: Secondary | ICD-10-CM | POA: Insufficient documentation

## 2023-08-09 DIAGNOSIS — S52692A Other fracture of lower end of left ulna, initial encounter for closed fracture: Secondary | ICD-10-CM | POA: Insufficient documentation

## 2023-08-09 DIAGNOSIS — S8001XA Contusion of right knee, initial encounter: Secondary | ICD-10-CM

## 2023-08-09 DIAGNOSIS — S0990XA Unspecified injury of head, initial encounter: Secondary | ICD-10-CM

## 2023-08-09 DIAGNOSIS — I119 Hypertensive heart disease without heart failure: Secondary | ICD-10-CM | POA: Diagnosis not present

## 2023-08-09 DIAGNOSIS — S0083XA Contusion of other part of head, initial encounter: Secondary | ICD-10-CM | POA: Insufficient documentation

## 2023-08-09 DIAGNOSIS — W19XXXA Unspecified fall, initial encounter: Secondary | ICD-10-CM

## 2023-08-09 DIAGNOSIS — S63501A Unspecified sprain of right wrist, initial encounter: Secondary | ICD-10-CM

## 2023-08-09 DIAGNOSIS — S62102A Fracture of unspecified carpal bone, left wrist, initial encounter for closed fracture: Secondary | ICD-10-CM

## 2023-08-09 DIAGNOSIS — M25532 Pain in left wrist: Secondary | ICD-10-CM | POA: Diagnosis present

## 2023-08-09 MED ORDER — ACETAMINOPHEN 325 MG PO TABS
650.0000 mg | ORAL_TABLET | Freq: Once | ORAL | Status: AC
  Administered 2023-08-09: 650 mg via ORAL
  Filled 2023-08-09: qty 2

## 2023-08-09 NOTE — ED Provider Triage Note (Signed)
 Emergency Medicine Provider Triage Evaluation Note  Morgan Rojas , a 83 y.o. female  was evaluated in triage.  Pt complains of right and left wrist pain.  Right knee pain.  Neck pain and some minor head injury after a fall..  Review of Systems  Positive:  Negative:   Physical Exam  BP (!) 156/70 (BP Location: Left Arm)   Pulse 68   Temp 98 F (36.7 C) (Oral)   Resp 17   SpO2 97%  Gen:   Awake, no distress   Resp:  Normal effort  MSK:   Moves extremities without difficulty.  Swelling noted to the left wrist, right wrist tender, right knee is swollen and tender, C-spine tender Other:    Medical Decision Making  Medically screening exam initiated at 1:55 PM.  Appropriate orders placed.  Morgan Rojas was informed that the remainder of the evaluation will be completed by another provider, this initial triage assessment does not replace that evaluation, and the importance of remaining in the ED until their evaluation is complete.     Delsie Figures, PA-C 08/09/23 1356

## 2023-08-09 NOTE — Discharge Instructions (Signed)
 Apply ice is much as possible to the left wrist and right knee. Take Tylenol for pain as needed Wear the sling when you are out walking around as this will help keep the arm elevated to decrease swelling Call Dr. Noberto Bastos office tomorrow.  They will schedule an appointment for you within this week or next week.  Please tell them that the ER discussed this with him and he wants for you to follow-up in the office. Return to emergency department if worsening

## 2023-08-09 NOTE — ED Provider Notes (Signed)
 [facility] Provider Note    Event Date/Time   First MD Initiated Contact with Patient 08/09/23 1706     (approximate)   History   Fall   HPI  Morgan Rojas is a 83 y.o. female history of diabetes, hypertension, heart disease presents emergency department after a fall.  Patient was at the funeral home for a funeral.  States she has never been to this from before and missed a step going up and hit a brick wall.  Complaining of left and right wrist pain.  Right knee pain.  Bruise on the forehead.  And some neck pain.  No LOC.  No vomiting.  No chest pain or shortness of breath at time of the fall      Physical Exam   Triage Vital Signs: ED Triage Vitals  Encounter Vitals Group     BP 08/09/23 1353 (!) 156/70     Systolic BP Percentile --      Diastolic BP Percentile --      Pulse Rate 08/09/23 1353 68     Resp 08/09/23 1353 17     Temp 08/09/23 1353 98 F (36.7 C)     Temp Source 08/09/23 1353 Oral     SpO2 08/09/23 1353 97 %     Weight --      Height --      Head Circumference --      Peak Flow --      Pain Score 08/09/23 1355 10     Pain Loc --      Pain Education --      Exclude from Growth Chart --     Most recent vital signs: Vitals:   08/09/23 1353  BP: (!) 156/70  Pulse: 68  Resp: 17  Temp: 98 F (36.7 C)  SpO2: 97%     General: Awake, no distress.   CV:  Good peripheral perfusion. regular rate and  rhythm Resp:  Normal effort.  Abd:  No distention.   Other:  Patient has bruise noted to the right side of the forehead, C-spine mildly tender, left wrist is swollen tender most likely has fracture, neurovascular is intact, right wrist swollen tender, right knee is swollen and tender, full range of motion, neurovascular intact   ED Results / Procedures / Treatments   Labs (all labs ordered are listed, but only abnormal results are displayed) Labs Reviewed - No data to display   EKG     RADIOLOGY CT of the head  cervical spine X-ray of the left and right wrist, right knee    PROCEDURES:   Procedures Chief Complaint  Patient presents with   Fall      MEDICATIONS ORDERED IN ED: Medications  acetaminophen (TYLENOL) tablet 650 mg (650 mg Oral Given 08/09/23 1710)     IMPRESSION / MDM / ASSESSMENT AND PLAN / ED COURSE  I reviewed the triage vital signs and the nursing notes.                              Differential diagnosis includes, but is not limited to, fall, subdural, SAH, contusion, fracture, sprain  Patient's presentation is most consistent with acute illness / injury with system symptoms.   X-ray of the left wrist independently reviewed interpreted by me as having a fracture of the distal ulna and radius.  Confirmed by radiology, X-ray of the right wrist independently reviewed interpreted by me  as being negative, confirmed by radiology  X-ray of the right knee negative for any acute abnormality, confirmed by radiology  Did discuss these findings with the patient.  Will go ahead and put her in a sugar-tong OCL, sling, and a Velcro wrist brace, Ace wrap be applied to the knee  Dr. Cleora Daft in to the see the patient for shared visit  CT of the head and cervical spine independently reviewed interpreted by me as being negative for any acute abnormality  I did explain all the findings to the patient.    Consult to orthopedics, Dr. Clyda Dark states splint and follow-up in the office.   Patient was agreeable to this treatment plan.  She is to take Tylenol for pain.  We did discuss RICE, return emergency department if symptoms are worsening.  She was in agreement and discharged in stable condition in the care of her family.  FINAL CLINICAL IMPRESSION(S) / ED DIAGNOSES   Final diagnoses:  Fall, initial encounter  Closed fracture of left wrist, initial encounter  Sprain of right wrist, initial encounter  Contusion of right knee, initial encounter  Minor head injury, initial  encounter     Rx / DC Orders   ED Discharge Orders     None        Note:  This document was prepared using Dragon voice recognition software and may include unintentional dictation errors.    Delsie Figures, PA-C 08/09/23 Mena Stai, MD 08/09/23 2302

## 2023-08-09 NOTE — ED Triage Notes (Signed)
 Pt here after falling at a funeral and hit her head and both wrist and her right knee. Pt does not think she passed out. Pt alert and oriented. Ice bags applied.

## 2023-08-25 ENCOUNTER — Ambulatory Visit
Admission: RE | Admit: 2023-08-25 | Discharge: 2023-08-25 | Disposition: A | Discharge status: Home / Self Care | Source: Ambulatory Visit | Attending: Student | Admitting: Student

## 2023-08-25 ENCOUNTER — Other Ambulatory Visit: Payer: Self-pay | Admitting: Student

## 2023-08-25 DIAGNOSIS — S52692A Other fracture of lower end of left ulna, initial encounter for closed fracture: Secondary | ICD-10-CM | POA: Insufficient documentation

## 2023-08-25 DIAGNOSIS — S52552A Other extraarticular fracture of lower end of left radius, initial encounter for closed fracture: Secondary | ICD-10-CM

## 2023-09-01 ENCOUNTER — Other Ambulatory Visit: Payer: Self-pay | Admitting: Orthopedic Surgery

## 2023-09-02 ENCOUNTER — Encounter: Payer: Self-pay | Admitting: Orthopedic Surgery

## 2023-09-03 ENCOUNTER — Ambulatory Visit
Admission: RE | Admit: 2023-09-03 | Discharge: 2023-09-03 | Disposition: A | Discharge status: Home / Self Care | Attending: Orthopedic Surgery | Admitting: Orthopedic Surgery

## 2023-09-03 ENCOUNTER — Other Ambulatory Visit: Payer: Self-pay

## 2023-09-03 ENCOUNTER — Encounter
Admission: RE | Disposition: A | Discharge status: Home / Self Care | Payer: Self-pay | Source: Home / Self Care | Attending: Orthopedic Surgery

## 2023-09-03 ENCOUNTER — Ambulatory Visit: Payer: Self-pay | Admitting: Anesthesiology

## 2023-09-03 ENCOUNTER — Encounter: Payer: Self-pay | Admitting: Orthopedic Surgery

## 2023-09-03 ENCOUNTER — Ambulatory Visit: Payer: Self-pay

## 2023-09-03 DIAGNOSIS — I509 Heart failure, unspecified: Secondary | ICD-10-CM | POA: Insufficient documentation

## 2023-09-03 DIAGNOSIS — S52572P Other intraarticular fracture of lower end of left radius, subsequent encounter for closed fracture with malunion: Secondary | ICD-10-CM | POA: Diagnosis present

## 2023-09-03 DIAGNOSIS — X58XXXD Exposure to other specified factors, subsequent encounter: Secondary | ICD-10-CM | POA: Insufficient documentation

## 2023-09-03 DIAGNOSIS — S52692P Other fracture of lower end of left ulna, subsequent encounter for closed fracture with malunion: Secondary | ICD-10-CM | POA: Diagnosis present

## 2023-09-03 DIAGNOSIS — I252 Old myocardial infarction: Secondary | ICD-10-CM | POA: Insufficient documentation

## 2023-09-03 DIAGNOSIS — I25810 Atherosclerosis of coronary artery bypass graft(s) without angina pectoris: Secondary | ICD-10-CM | POA: Insufficient documentation

## 2023-09-03 DIAGNOSIS — Z79899 Other long term (current) drug therapy: Secondary | ICD-10-CM | POA: Diagnosis not present

## 2023-09-03 DIAGNOSIS — E7800 Pure hypercholesterolemia, unspecified: Secondary | ICD-10-CM | POA: Insufficient documentation

## 2023-09-03 DIAGNOSIS — Z955 Presence of coronary angioplasty implant and graft: Secondary | ICD-10-CM | POA: Diagnosis not present

## 2023-09-03 DIAGNOSIS — E119 Type 2 diabetes mellitus without complications: Secondary | ICD-10-CM | POA: Insufficient documentation

## 2023-09-03 DIAGNOSIS — I110 Hypertensive heart disease with heart failure: Secondary | ICD-10-CM | POA: Insufficient documentation

## 2023-09-03 DIAGNOSIS — Z7984 Long term (current) use of oral hypoglycemic drugs: Secondary | ICD-10-CM | POA: Insufficient documentation

## 2023-09-03 HISTORY — PX: OPEN REDUCTION INTERNAL FIXATION (ORIF) DISTAL RADIAL FRACTURE: SHX5989

## 2023-09-03 LAB — GLUCOSE, CAPILLARY: Glucose-Capillary: 131 mg/dL — ABNORMAL HIGH (ref 70–99)

## 2023-09-03 SURGERY — OPEN REDUCTION INTERNAL FIXATION (ORIF) DISTAL RADIUS FRACTURE
Anesthesia: General | Laterality: Left

## 2023-09-03 MED ORDER — LIDOCAINE HCL (PF) 2 % IJ SOLN
INTRAMUSCULAR | Status: AC
Start: 2023-09-03 — End: ?
  Filled 2023-09-03: qty 5

## 2023-09-03 MED ORDER — ROPIVACAINE HCL 5 MG/ML IJ SOLN
INTRAMUSCULAR | Status: DC | PRN
Start: 2023-09-03 — End: 2023-09-03
  Administered 2023-09-03: 20 mL via PERINEURAL

## 2023-09-03 MED ORDER — ROPIVACAINE HCL 5 MG/ML IJ SOLN
INTRAMUSCULAR | Status: AC
  Filled 2023-09-03: qty 20

## 2023-09-03 MED ORDER — EPHEDRINE SULFATE (PRESSORS) 50 MG/ML IJ SOLN
INTRAMUSCULAR | Status: DC | PRN
  Administered 2023-09-03: 5 mg via INTRAVENOUS

## 2023-09-03 MED ORDER — FENTANYL CITRATE PF 50 MCG/ML IJ SOSY
50.0000 ug | PREFILLED_SYRINGE | INTRAMUSCULAR | Status: DC | PRN
  Administered 2023-09-03: 50 ug via INTRAVENOUS

## 2023-09-03 MED ORDER — CEFAZOLIN SODIUM-DEXTROSE 2-3 GM-%(50ML) IV SOLR
INTRAVENOUS | Status: DC | PRN
  Administered 2023-09-03: 2 g via INTRAVENOUS

## 2023-09-03 MED ORDER — DEXMEDETOMIDINE HCL IN NACL 80 MCG/20ML IV SOLN
INTRAVENOUS | Status: DC | PRN
  Administered 2023-09-03: 4 ug via INTRAVENOUS

## 2023-09-03 MED ORDER — VANCOMYCIN HCL IN DEXTROSE 1-5 GM/200ML-% IV SOLN: 1000.0000 mg | INTRAVENOUS | Status: DC

## 2023-09-03 MED ORDER — VANCOMYCIN HCL 500 MG IV SOLR
INTRAVENOUS | Status: AC
  Filled 2023-09-03: qty 20

## 2023-09-03 MED ORDER — HYDROCODONE-ACETAMINOPHEN 5-300 MG PO TABS: 1.0000 | ORAL_TABLET | ORAL | 0 refills | Status: AC | PRN

## 2023-09-03 MED ORDER — MIDAZOLAM HCL 2 MG/2ML IJ SOLN
INTRAMUSCULAR | Status: AC
  Filled 2023-09-03: qty 2

## 2023-09-03 MED ORDER — GENTAMICIN SULFATE 40 MG/ML IJ SOLN: 5.0000 mg/kg | INTRAVENOUS | Status: DC

## 2023-09-03 MED ORDER — PROPOFOL 10 MG/ML IV BOLUS
INTRAVENOUS | Status: AC
  Filled 2023-09-03: qty 20

## 2023-09-03 MED ORDER — CEFAZOLIN SODIUM-DEXTROSE 2-3 GM-%(50ML) IV SOLR
INTRAVENOUS | Status: AC
  Filled 2023-09-03: qty 50

## 2023-09-03 MED ORDER — LIDOCAINE HCL (PF) 2 % IJ SOLN
INTRAMUSCULAR | Status: DC | PRN
  Administered 2023-09-03: 3 mL via INTRADERMAL

## 2023-09-03 MED ORDER — LACTATED RINGERS IV SOLN: INTRAVENOUS | Status: DC

## 2023-09-03 MED ORDER — DEXMEDETOMIDINE HCL IN NACL 80 MCG/20ML IV SOLN
INTRAVENOUS | Status: AC
  Filled 2023-09-03: qty 20

## 2023-09-03 MED ORDER — FENTANYL CITRATE (PF) 100 MCG/2ML IJ SOLN
INTRAMUSCULAR | Status: AC
  Filled 2023-09-03: qty 2

## 2023-09-03 MED ORDER — PROPOFOL 500 MG/50ML IV EMUL
INTRAVENOUS | Status: DC | PRN
  Administered 2023-09-03: 20 mg via INTRAVENOUS
  Administered 2023-09-03: 50 ug/kg/min via INTRAVENOUS

## 2023-09-03 MED ORDER — 0.9 % SODIUM CHLORIDE (POUR BTL) OPTIME
TOPICAL | Status: DC | PRN
  Administered 2023-09-03: 1000 mL

## 2023-09-03 SURGICAL SUPPLY — 28 items
BIT DRILL 2 FAST STEP (BIT) IMPLANT
BIT DRILL 2.5X4 QC (BIT) IMPLANT
BNDG ELASTIC 3X5.8 VLCR NS LF (GAUZE/BANDAGES/DRESSINGS) ×1 IMPLANT
CHLORAPREP W/TINT 26 (MISCELLANEOUS) ×1 IMPLANT
COVER LIGHT HANDLE UNIVERSAL (MISCELLANEOUS) ×2 IMPLANT
DRAPE FLUOR MINI C-ARM 54X84 (DRAPES) ×1 IMPLANT
ELECTRODE REM PT RTRN 9FT ADLT (ELECTROSURGICAL) ×1 IMPLANT
GAUZE SPONGE 4X4 12PLY STRL (GAUZE/BANDAGES/DRESSINGS) ×1 IMPLANT
GAUZE XEROFORM 1X8 LF (GAUZE/BANDAGES/DRESSINGS) ×1 IMPLANT
GLOVE SURG SYN 9.0 PF PI (GLOVE) ×1 IMPLANT
GOWN STRL REIN 2XL XLG LVL4 (GOWN DISPOSABLE) ×1 IMPLANT
GOWN STRL REUS W/ TWL LRG LVL3 (GOWN DISPOSABLE) ×1 IMPLANT
KIT TURNOVER KIT A (KITS) ×1 IMPLANT
KWIRE FX5X1.6XNS BN SS (WIRE) IMPLANT
MULTIDIRECTIONAL THREADED PEG 16MM ×1 IMPLANT
MULTIDIRECTIONAL THREADED PEG 18MM ×1 IMPLANT
NS IRRIG 500ML POUR BTL (IV SOLUTION) ×1 IMPLANT
PACK EXTREMITY ARMC (MISCELLANEOUS) ×1 IMPLANT
PADDING CAST BLEND 3X4 STRL (MISCELLANEOUS) ×2 IMPLANT
PENCIL SMOKE EVACUATOR (MISCELLANEOUS) IMPLANT
PLATE SHORT 57 NRRW LT (Plate) IMPLANT
SCREW BN 12X3.5XNS CORT TI (Screw) IMPLANT
SCREW CORT 3.5X10 LNG (Screw) IMPLANT
SCREW MULTI DIRECT 16MM (Screw) IMPLANT
SCREW MULTI DIRECT 18MM (Screw) IMPLANT
SPLINT CAST 1 STEP 3X12 (MISCELLANEOUS) ×1 IMPLANT
SUT VICRYL 3-0 27IN (SUTURE) ×1 IMPLANT
SUTURE ETHLN 4-0 FS2 18XMF BLK (SUTURE) ×1 IMPLANT

## 2023-09-03 NOTE — Anesthesia Procedure Notes (Signed)
 Anesthesia Regional Block: Supraclavicular block   Pre-Anesthetic Checklist: , timeout performed,  Correct Patient, Correct Site, Correct Laterality,  Correct Procedure, Correct Position, site marked,  Risks and benefits discussed,  Surgical consent,  Pre-op evaluation,  At surgeon's request and post-op pain management  Laterality: Upper and Left  Prep: chloraprep       Needles:  Injection technique: Single-shot  Needle Type: Stimiplex     Needle Length: 9cm  Needle Gauge: 22     Additional Needles:   Procedures:,,,, ultrasound used (permanent image in chart),,    Narrative:  Start time: 09/03/2023 11:30 AM End time: 09/03/2023 11:33 AM Injection made incrementally with aspirations every 5 mL.  Performed by: Personally   Additional Notes: Patient consented for risk and benefits of nerve block including but not limited to nerve damage, Horner's syndrome, failed block, bleeding and infection.  Patient voiced understanding.  Functioning IV was confirmed and monitors were applied.  Timeout done prior to procedure and prior to any sedation being given to the patient.  Patient confirmed procedure site prior to any sedation given to the patient.  A 50mm 22ga Stimuplex needle was used. Sterile prep,hand hygiene and sterile gloves were used.  Minimal sedation used for procedure.  No paresthesia endorsed by patient during the procedure.  Negative aspiration and negative test dose prior to incremental administration of local anesthetic. The patient tolerated the procedure well with no immediate complications.

## 2023-09-03 NOTE — H&P (Signed)
 Chief Complaint Patient presents with Left Wrist - Follow-up, Pain   Subjective  Morgan Rojas is a 83 y.o. female who presents for Follow-up and Pain of the Left Wrist and Follow-up and Pain of the Right Wrist HPI History of Present Illness Morgan Rojas is an 83 year old female who presents for follow-up and discussion of possible repair of a distal radius and ulnar fracture. She was referred by Albino Alu for follow-up of her fracture.  The fracture occurred on April 14th, making it three weeks since the injury. She experiences significant discomfort, especially when turning her wrist in certain directions. She has been wearing a cast and has had a CT scan, although she is unsure if the report is available yet.  She lives with her husband, who is 60 years old, and she is responsible for all the driving, cooking, and cleaning. She is concerned about her ability to continue these activities if she undergoes surgery.  Her past medical history is significant for heart issues, including a coronary artery bypass graft (CABG) performed twice, with the most recent in 2014, and a STEMI in 2023 treated with rotational atherectomy and a drug-eluting stent. Her ejection fraction is reported to be 50-55%. No current chest pain.  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 90 tablet 1 blood glucose meter kit Use once daily 1 each 0 cholecalciferol (VITAMIN D3) 2,000 unit tablet Take 2,000 Units by mouth once daily cinnamon bark (CINNAMON ORAL) Take by mouth cyanocobalamin (VITAMIN B12) 1000 MCG tablet Take 1,000 mcg by mouth once daily Take 500mcg a day estradioL (ESTRACE) 0.01 % (0.1 mg/gram) vaginal cream Insert fingertip amount nightly x 2 weeks, then every other night x 2 weeks, then 2-3 times weekly for maintenance 42.5 g 1 fluticasone propionate (FLONASE) 50 mcg/actuation nasal spray spray 2 sprays into each nostril every day 48 g 3 glimepiride (AMARYL) 4 MG tablet take 1 tablet by mouth once daily. 90 tablet 3 lancets (ONETOUCH DELICA LANCETS) Use 1 lancet 1-2 times per week 100 each 2 losartan (COZAAR) 25 MG tablet TAKE 1 TABLET BY MOUTH EVERY DAY 90 tablet 3 meclizine (ANTIVERT) 25 mg tablet TAKE 1 TABLET BY MOUTH 3 TIMES DAILY AS NEEDED FOR DIZZINESS. 90 tablet 0 metoprolol SUCCinate (TOPROL-XL) 25 MG XL tablet Take 1 tablet (25 mg total) by mouth once daily 90 tablet 3 multivitamin tablet Take 1 tablet by mouth once daily nitroGLYcerin (NITROSTAT) 0.4 MG SL tablet PLACE 1 TABLET (0.4 MG TOTAL) UNDER THE TONGUE AS NEEDED (MAY TAKE UP TO 3 TIMES EVERY 5 MIN) 25 tablet 2 sertraline  (ZOLOFT ) 25 MG tablet take 1 tablet by mouth every day 90 tablet 3 traZODone (DESYREL) 50 MG tablet TAKE 1 TABLET BY MOUTH EVERYDAY AT BEDTIME 90 tablet 3 TURMERIC ORAL Take by mouth metoprolol succinate (TOPROL-XL) 50 MG XL tablet TAKE 1 TABLET (50 MG TOTAL) BY MOUTH ONCE DAILY TAKE 1/2 TABLET BY MOUTH DAILY. (Patient  not taking: Reported on 09/01/2023) 90 tablet 3 montelukast (SINGULAIR) 10 mg tablet Take 1 tablet (10 mg total) by mouth at bedtime (Patient not taking: Reported on 09/01/2023) 30 tablet 11 ONETOUCH VERIO TEST STRIPS test strip USE 1 STRIP TO TEST BLOOD SUGAR 1-2 TIMES PER WEEK (Patient not taking: Reported on 09/01/2023) 50 strip  5 rutin/hesp/bioflav/C/herbal196 (BIOFLEX ORAL) Take by mouth (Patient not taking: Reported on 09/01/2023)  No facility-administered medications prior to visit.    Objective  Vitals: 09/01/23 1408 BP: (!) 140/66 Weight: 74.8 kg (165 lb) Height: 167.6 cm (5\' 6" ) PainSc: 8 PainLoc: Wrist  Body mass index is 26.63 kg/m.  Home Vitals:   Physical Exam Physical Exam HEENT: Normal dentition. CHEST: Clear to auscultation bilaterally. CARDIOVASCULAR: Regular rate and rhythm. NEUROLOGICAL: Sensation intact in fingers.  Constitutional: alert, in NAD, and communicates well Results RADIOLOGY X-ray: Volar displacement interarticular of the distal radius. Minimally displaced distal ulnar shaft fracture proximal to the ulnar styloid. (08/25/2023) CT scan: Shifted intraarticular fragment with a gap in the joint. (08/25/2023)  DIAGNOSTIC Cardiac catheterization: LMA to LAD occluded with heavily calcified stenosis, left proximal circumflex, rotational atherectomy and a drug-eluting stent, proximal circumflex. (October 2020) 2D echocardiography: Ejection fraction of 50-55%. (October 2020)    Assessment/Plan:  Assessment & Plan Volar displaced distal radius fracture Three weeks post-injury, she presents with a volar displaced intraarticular distal radius fracture. The forward shift risks significant functional impairment if untreated. Surgical repair is recommended to realign the fragment. Her cardiac status, with an ejection fraction of 50-55%, is adequate for surgery. Postoperatively, a removable brace will provide more functionality than the current cast. Schedule urgent surgical repair for Friday and maintain the cast until surgery.  Minimally displaced distal ulnar shaft fracture She has a minimally displaced distal ulnar shaft fracture proximal to the ulnar styloid, likely to heal without surgical intervention, avoiding added complexity to the surgery.  Coronary artery disease with  CABG and STEMI She has coronary artery disease with CABG and STEMI. Her cardiac status, with an ejection fraction of 50-55%, is sufficient for the planned orthopedic surgery.  Heart failure She has heart failure, currently well-managed, with cardiac status adequate for the planned surgery. Diagnoses and all orders for this visit:  Other closed fracture of distal end of left ulna with malunion, subsequent encounter  Other closed intra-articular fracture of distal end of left radius with malunion, subsequent encounter  This visit was coded based on medical decision making (MDM).    Future Appointments  Date/Time Provider Department Center Visit Type 09/06/2023 10:30 AM Victory Gravel, MD [facility] C POST-OP 09/17/2023 10:30 AM Victory Gravel, MD [facility] C POST-OP 10/01/2023 10:30 AM Victory Gravel, MD [facility] C POST-OP 12/01/2023 2:45 PM Paraschos, Authur Leghorn, MD [facility] C FOLLOW UP    There are no Patient Instructions on file for this visit.  An after visit summary was provided for the patient either in written format (printed) or through My [facility].  This note has been created using automated tools and reviewed for accuracy by [facility].  Electronically signed by Victory Gravel, MD at 09/02/2023 8:27 AM EDT   Reviewed  H+P. No changes noted.

## 2023-09-03 NOTE — Anesthesia Preprocedure Evaluation (Signed)
 Anesthesia Evaluation  Patient identified by MRN, date of birth, ID band Patient awake    Reviewed: Allergy & Precautions, NPO status , Patient's Chart, lab work & pertinent test results  History of Anesthesia Complications Negative for: history of anesthetic complications  Airway Mallampati: III  TM Distance: <3 FB Neck ROM: full    Dental  (+) Chipped   Pulmonary neg pulmonary ROS, neg shortness of breath   Pulmonary exam normal        Cardiovascular Exercise Tolerance: Poor hypertension, (-) angina + CAD, + Past MI, + Cardiac Stents and + CABG  (-) DOE Normal cardiovascular exam     Neuro/Psych negative neurological ROS  negative psych ROS   GI/Hepatic negative GI ROS, Neg liver ROS,,,  Endo/Other  diabetes, Type 2    Renal/GU negative Renal ROS  negative genitourinary   Musculoskeletal   Abdominal   Peds  Hematology negative hematology ROS (+)   Anesthesia Other Findings Patient has cardiac clearance for this procedure.   Past Medical History: No date: CAD (coronary artery disease)     Comment:  s/p CABG in 2014 in the state of [place]  No date: Cancer (HCC)     Comment:  skin No date: Diabetes mellitus without complication (HCC) No date: Heart disease  Past Surgical History: No date: CARDIAC SURGERY No date: carpel tunnel No date: STENT REMOVAL  BMI    Body Mass Index: 26.16 kg/m      Reproductive/Obstetrics negative OB ROS                             Anesthesia Physical Anesthesia Plan  ASA: 3  Anesthesia Plan: General   Post-op Pain Management: Regional block   Induction: Intravenous  PONV Risk Score and Plan: Propofol infusion and TIVA  Airway Management Planned: Natural Airway and Nasal Cannula  Additional Equipment:   Intra-op Plan:   Post-operative Plan:   Informed Consent: I have reviewed the patients History and Physical, chart, labs and  discussed the procedure including the risks, benefits and alternatives for the proposed anesthesia with the patient or authorized representative who has indicated his/her understanding and acceptance.     Dental Advisory Given  Plan Discussed with: Anesthesiologist, CRNA and Surgeon  Anesthesia Plan Comments: (Patient consented for risks of anesthesia including but not limited to:  - adverse reactions to medications - risk of airway placement if required - damage to eyes, teeth, lips or other oral mucosa - nerve damage due to positioning  - sore throat or hoarseness - Damage to heart, brain, nerves, lungs, other parts of body or loss of life  Patient voiced understanding and assent.)       Anesthesia Quick Evaluation

## 2023-09-03 NOTE — Transfer of Care (Signed)
 Immediate Anesthesia Transfer of Care Note  Patient: Morgan Rojas  Procedure(s) Performed: OPEN REDUCTION INTERNAL FIXATION (ORIF) DISTAL RADIUS FRACTURE (Left)  Patient Location: PACU  Anesthesia Type: General  Level of Consciousness: awake, alert  and patient cooperative  Airway and Oxygen Therapy: Patient Spontanous Breathing and Patient connected to supplemental oxygen  Post-op Assessment: Post-op Vital signs reviewed, Patient's Cardiovascular Status Stable, Respiratory Function Stable, Patent Airway and No signs of Nausea or vomiting  Post-op Vital Signs: Reviewed and stable  Complications: No notable events documented.

## 2023-09-03 NOTE — Anesthesia Postprocedure Evaluation (Signed)
 Anesthesia Post Note  Patient: Morgan Rojas  Procedure(s) Performed: OPEN REDUCTION INTERNAL FIXATION (ORIF) DISTAL RADIUS FRACTURE (Left)  Patient location during evaluation: PACU Anesthesia Type: General Level of consciousness: awake and alert Pain management: pain level controlled Vital Signs Assessment: post-procedure vital signs reviewed and stable Respiratory status: spontaneous breathing, nonlabored ventilation and respiratory function stable Cardiovascular status: blood pressure returned to baseline and stable Postop Assessment: no apparent nausea or vomiting Anesthetic complications: no   No notable events documented.   Last Vitals:  Vitals:   09/03/23 1135 09/03/23 1320  BP: (!) 160/50 (!) 141/48  Pulse: (!) 57 68  Resp: 10 20  Temp:  36.7 C  SpO2: 99% 98%    Last Pain:  Vitals:   09/03/23 1320  TempSrc:   PainSc: 0-No pain                 Portia Brittle Charolett Yarrow

## 2023-09-03 NOTE — Progress Notes (Signed)
 Assisted Piscitello ANMD with left, supraclavicular, ultrasound guided block. Side rails up, monitors on throughout procedure. See vital signs in flow sheet. Tolerated Procedure well.

## 2023-09-03 NOTE — Discharge Instructions (Signed)
 Keep arm elevated is much as possible through the weekend Ice to the back of the hand and wrist today and tomorrow Work on finger motion when your block is worn off is much as you can Keep dressing clean and dry Pain medicine as directed Call office if you are having problems [phone]

## 2023-09-03 NOTE — Op Note (Signed)
 09/03/2023  1:22 PM  PATIENT:  Morgan Rojas  83 y.o. female  PRE-OPERATIVE DIAGNOSIS:  Other closed fracture of distal end of left ulna with malunion, subsequent encounter S52.692P Other closed intra-articular fracture of distal end of left radius with malunion, subsequent encounter S52.572P  POST-OPERATIVE DIAGNOSIS:  Other closed fracture of distal end of left ulna with malunion, subsequent encounter Other closed intra-articular fracture of distal end of left radius with malunion, subsequent encounter  PROCEDURE:  Procedure(s): OPEN REDUCTION INTERNAL FIXATION (ORIF) DISTAL RADIUS FRACTURE (Left)  SURGEON: Terance Felt, MD  ASSISTANTS: None  ANESTHESIA:   IV sedation and paracervical block  EBL:  Total I/O In: 500 [I.V.:450; IV Piggyback:50] Out: 5 [Blood:5]  BLOOD ADMINISTERED:none  DRAINS: none   LOCAL MEDICATIONS USED: Preop block given by anesthesia  SPECIMEN:  No Specimen  DISPOSITION OF SPECIMEN:  N/A  COUNTS:  YES  TOURNIQUET:   Total Tourniquet Time Documented: Upper Arm (Left) - 26 minutes Total: Upper Arm (Left) - 26 minutes   IMPLANTS: [employer] standard narrow plate with 4 cortical screws into distal pegs  DICTATION: .Dragon Dictation patient was brought to the operating room and after the arm was prepped and draped in the usual sterile fashion after a regional block placed by anesthesia before coming back to the OR was obtained.   Tourniquet having been applied to the upper arm.  Appropriate patient identification and timeout procedures were completed.  Tourniquet was raised and a volar approach was made after traction was applied at the end of the table through fingertrap traction.  Approach is center of the FCR tendon.  Tendon sheath incised and the tendon retracted radially protect the radial artery and associated veins.  The deep fascia was incised and the pronator was elevated off the distal and proximal fragments with exposure of the fracture  site.  The fracture was intra articular with multiple interposed tissue  fragments and with traction applied and Freer elevator adequate alignment was obtained.  A standard length narrow with DVR plate applied with proximal first technique using a K wire to hold the plate in position position of the plate was checked in both AP and lateral projections.  When acceptable position was obtained the slotted hole screw was placed and then the more distal screw to get compression and restore alignment to the joint.  The 2 proximal cortical screws were then placed.  The distal peg holes were filled just with 2 multidirectional screws as it was a volarly displaced fracture the remaining drill guides were removed off the plate.  Traction was removed and under fluoroscopic views there is no motion of the fracture.   The wound irrigated with tourniquet let down.  Wound was then closed with 3-0 Vicryl for the deep tissue and 4-0 nylon in a simple interrupted manner..  Dressing of 4 x 4's web roll and volar splint followed by Ace wrap applied.   PLAN OF CARE: Discharge to home after PACU  PATIENT DISPOSITION:  PACU - hemodynamically stable.

## 2023-09-06 ENCOUNTER — Encounter: Payer: Self-pay | Admitting: Orthopedic Surgery

## 2023-09-08 ENCOUNTER — Encounter: Payer: Self-pay | Admitting: Orthopedic Surgery

## 2023-11-24 ENCOUNTER — Ambulatory Visit: Admitting: Podiatry

## 2023-11-24 DIAGNOSIS — G5781 Other specified mononeuropathies of right lower limb: Secondary | ICD-10-CM | POA: Diagnosis not present

## 2023-11-24 MED ORDER — TRIAMCINOLONE ACETONIDE 40 MG/ML IJ SUSP
20.0000 mg | Freq: Once | INTRAMUSCULAR | Status: AC
  Administered 2023-11-24: 20 mg

## 2023-11-24 NOTE — Progress Notes (Addendum)
 She presents today chief complaint of burning into her right foot.  Objective: Vital signs are stable alert oriented x 3 she has palpable neuroma that she did previously to the third interdigital space of her right foot.  Assessment: Neuroma third interspace right foot.  I injected a small amount of Kenalog  today 10 mg and local anesthetic to the third interdigital space.  Plan: Injected Kenalog  10 mg third interdigital space right foot.  Follow-up with her as needed.

## 2023-12-14 ENCOUNTER — Telehealth: Payer: Self-pay | Admitting: Lab

## 2023-12-14 NOTE — Telephone Encounter (Signed)
 Patient states has injection last visit and states foot is still burning no relief what should she did? Would like a call back with advise.[phone] .

## 2023-12-22 ENCOUNTER — Ambulatory Visit: Admitting: Podiatry

## 2023-12-22 ENCOUNTER — Ambulatory Visit (INDEPENDENT_AMBULATORY_CARE_PROVIDER_SITE_OTHER)

## 2023-12-22 DIAGNOSIS — M2041 Other hammer toe(s) (acquired), right foot: Secondary | ICD-10-CM | POA: Diagnosis not present

## 2023-12-22 DIAGNOSIS — G5781 Other specified mononeuropathies of right lower limb: Secondary | ICD-10-CM | POA: Diagnosis not present

## 2023-12-22 NOTE — Progress Notes (Signed)
 She presents today concerned that she has hammertoes and that she may have injured the fourth toe on the right foot.  States that the neuroma really just does not seem to be getting much better.  Objective: Vital signs are stable alert oriented x 3.  Pulses are palpable.  Her toes are tender to the touch.  She has severe pain on palpation third interspace of the right foot palpable Mulder's click.  Radiographs taken today of the right foot demonstrate osseously mature individual significant osteopenia demineralization.  No fractures are identified.  Rigid hammertoe deformities are noted.  Assessment: Hammertoe deformities right foot.  Continued pain to neuroma third interdigital space right foot.  Plan: Discussed the pros and cons use of dehydrated alcohol we injected 1-1/2 cc 4% dehydrated alcohol to the intermetatarsal area of the plantar nerve third interdigital space.  Tolerated procedure well follow-up with her in 3 weeks.  She will follow-up with Dr. May for routine nail debridement.

## 2024-01-26 ENCOUNTER — Ambulatory Visit: Admitting: Podiatry

## 2024-01-26 DIAGNOSIS — G5781 Other specified mononeuropathies of right lower limb: Secondary | ICD-10-CM | POA: Diagnosis not present

## 2024-01-26 NOTE — Progress Notes (Signed)
 She presents today for her second neurolysis injection third interdigital space of the right foot.  She states that the injection helped a little bit last time but not as much as it needs to help.  She is wondering if there is any type of topical creams that she could use.  Objective: Vital signs are stable she is alert and oriented x 3.  Pulses are palpable.  She still has a Mulder's click to the third interspace of the right foot with considerable tenderness on palpation.  She also has a little ecchymosis most most likely from her blood thinner and the injection from last visit.  Assessment: Neuroma third interdigital space right foot.  Plan: I injected her second dose of dehydrated alcohol to the third interspace of the right foot today.  She tolerated this well.  Pedal injection of 4% dehydrated alcohol was 1.5 mL.  I will follow-up with her in 3 weeks.

## 2024-02-03 ENCOUNTER — Ambulatory Visit: Admitting: Podiatry

## 2024-02-03 ENCOUNTER — Encounter: Payer: Self-pay | Admitting: Podiatry

## 2024-02-03 DIAGNOSIS — M2041 Other hammer toe(s) (acquired), right foot: Secondary | ICD-10-CM

## 2024-02-03 DIAGNOSIS — Q828 Other specified congenital malformations of skin: Secondary | ICD-10-CM

## 2024-02-03 DIAGNOSIS — M2042 Other hammer toe(s) (acquired), left foot: Secondary | ICD-10-CM

## 2024-02-03 DIAGNOSIS — E119 Type 2 diabetes mellitus without complications: Secondary | ICD-10-CM

## 2024-02-03 DIAGNOSIS — Z0189 Encounter for other specified special examinations: Secondary | ICD-10-CM | POA: Diagnosis not present

## 2024-02-11 NOTE — Progress Notes (Signed)
 Subjective:  Patient ID: Morgan Rojas, female    DOB: 1941-04-25,  MRN: [id number]  Morgan Rojas presents to clinic today for for annual diabetic foot examination and corn(s) right foot and painful mycotic toenails that are difficult to trim. Painful toenails interfere with ambulation. Aggravating factors include wearing enclosed shoe gear. Pain is relieved with periodic professional debridement. Painful corns are aggravated when weightbearing when wearing enclosed shoe gear. Pain is relieved with periodic professional debridement.  Chief Complaint  Patient presents with   Toe Pain    Diabetic foot care. Last A1c was 8.1 and last office visit with Dr. Stanton was in January. She complains of a corn on the tip of the 4th toe right foot   New problem(s): None.   PCP is Valora Lynwood FALCON, MD.  Allergies  Allergen Reactions   Sulfa Antibiotics Swelling   Cephalexin Other (See Comments)    Other reaction(s): Unknown    Lisinopril Other (See Comments)    Other reaction(s): Unknown    Metformin And Related    Nabumetone Other (See Comments)    Other reaction(s): Unknown    Other     Other reaction(s): Unknown Non-specific allergen    Penicillins    Simvastatin    Doxycycline Nausea Only and Nausea And Vomiting    Other reaction(s): Vomiting    Levofloxacin Itching    Review of Systems: Negative except as noted in the HPI.  Objective: No changes noted in today's physical examination. There were no vitals filed for this visit. Morgan Rojas is a pleasant 83 y.o. female WD, WN in NAD. AAO x 3.       Diabetic foot exam was performed with the following findings:   Normal sensation of 10g monofilament Intact posterior tibialis and dorsalis pedis pulses Vascular Examination: Capillary refill time immediate b/l. Palpable pedal pulses. Pedal hair present b/l. Pedal edema absent. No pain with calf compression b/l. Skin temperature gradient WNL b/l. No cyanosis or clubbing b/l.  No ischemia or gangrene noted b/l.   Neurological Examination: Sensation grossly intact b/l with 10 gram monofilament. Vibratory sensation intact b/l.   Dermatological Examination: Pedal skin with normal turgor, texture and tone b/l.  No open wounds. No interdigital macerations.   Toenails 1-5 b/l thick, discolored, elongated with subungual debris and pain on dorsal palpation.   Porokeratotic lesion(s) distal tip of right 4th toe. No erythema, no edema, no drainage, no fluctuance.  Musculoskeletal Examination: Muscle strength 5/5 to all lower extremity muscle groups bilaterally. Hammertoe(s) 2-5 b/l.  Radiographs: None    Assessment/Plan: 1. Porokeratosis   2. Acquired hammertoes of both feet   3. Type 2 diabetes mellitus without complication, without long-term current use of insulin  (HCC)   4. Encounter for diabetic foot exam (HCC)   Diabetic foot examination performed today.  All patient's and/or POA's questions/concerns addressed on today's visit. Toenails 1-5 b/l debrided in length and girth without incident. Porokeratotic lesion(s) distal tip of right 4th toe pared with sharp debridement without incident. Discussed daily padding of toe to minimize pressure to digit. Continue daily foot inspections and monitor blood glucose per PCP/Endocrinologist's recommendations. Continue soft, supportive shoe gear daily. Report any pedal injuries to medical professional. Call office if there are any questions/concerns.  Return in about 9 weeks (around 04/06/2024).  Delon LITTIE Merlin, DPM      [facility] LOCATION: [address]  [place], [place] [address]                   Office [phone]   [facility] LOCATION: [address] [place], [place] [address] Office [phone]

## 2024-02-23 ENCOUNTER — Ambulatory Visit: Admitting: Podiatry

## 2024-03-27 ENCOUNTER — Ambulatory Visit: Admitting: Podiatry

## 2024-03-27 DIAGNOSIS — G5781 Other specified mononeuropathies of right lower limb: Secondary | ICD-10-CM | POA: Diagnosis not present

## 2024-03-27 NOTE — Progress Notes (Signed)
 She presents today after having been sick with a viral pharyngitis preventing her from coming in for her chemical matrixectomy's.  She is thinks that she is may have missed 2 injections.  Objective: Vital signs are stable alert and oriented x 3.  She has pain on palpation to the third interdigital space right foot.  She also has hammertoe deformities with a distal clavus to the third digit of the right foot.  Assessment: Neuroma third interdigital space right foot being treated with dehydrated alcohol.  Hammertoe deformities with distal clavus.  Plan: Debrided distal clavus today injected 1.5 mL of 4% dehydrated alcohol to the third interdigital space of the right foot today.  I will follow-up with her in 3 weeks if possible for another injection.

## 2024-04-12 ENCOUNTER — Ambulatory Visit: Admitting: Podiatry

## 2024-04-12 DIAGNOSIS — G5781 Other specified mononeuropathies of right lower limb: Secondary | ICD-10-CM

## 2024-04-12 NOTE — Progress Notes (Signed)
 She presents today for follow-up of her neuroma third interspace of her right foot and burning of her toes #3 #4 right foot.  States that it is some better but I have not been walking as much because I still have pharyngitis.  Objective: Vital signs are stable and oriented x 3 flexible hammertoe deformities #3 #4 with pain on palpation to the third interdigital space of the right foot consistent with neuroma and palpable Mulder's click.  Assessment: Neuroma third interspace right foot.  Plan: I injected another dose dehydrated alcohol 4%.  1.5 cc was injected to the third interspace of the right foot.  Follow-up with her in 3 weeks

## 2024-04-13 ENCOUNTER — Ambulatory Visit: Admitting: Podiatry

## 2024-05-08 ENCOUNTER — Ambulatory Visit: Admitting: Podiatry

## 2024-05-08 DIAGNOSIS — G5781 Other specified mononeuropathies of right lower limb: Secondary | ICD-10-CM

## 2024-05-08 NOTE — Progress Notes (Signed)
 She presents today for follow-up of her neuroma third interspace of her right foot and burning of her toes #3 #4 right foot.  States that it is some better but I have not been walking as much because I still have pharyngitis.  Objective: Vital signs are stable and oriented x 3 flexible hammertoe deformities #3 #4 with pain on palpation to the third interdigital space of the right foot consistent with neuroma and palpable Mulder's click.  Assessment: Neuroma third interspace right foot.  Plan: I injected another dose dehydrated alcohol 4%.  1.5 cc was injected to the third interspace of the right foot.  Follow-up with her in 3 weeks

## 2024-06-05 ENCOUNTER — Ambulatory Visit: Admitting: Podiatry
